# Patient Record
Sex: Female | Born: 1935 | Race: White | Hispanic: No | Marital: Married | Smoking: Never smoker
Health system: Southern US, Community
[De-identification: ages and names within clinical notes are randomized; demographics above are authoritative.]

## PROBLEM LIST (undated history)

## (undated) DIAGNOSIS — I10 Essential (primary) hypertension: Secondary | ICD-10-CM

## (undated) DIAGNOSIS — IMO0001 Reserved for inherently not codable concepts without codable children: Secondary | ICD-10-CM

## (undated) DIAGNOSIS — I2510 Atherosclerotic heart disease of native coronary artery without angina pectoris: Secondary | ICD-10-CM

## (undated) DIAGNOSIS — R339 Retention of urine, unspecified: Secondary | ICD-10-CM

## (undated) DIAGNOSIS — E785 Hyperlipidemia, unspecified: Secondary | ICD-10-CM

## (undated) DIAGNOSIS — F32A Depression, unspecified: Secondary | ICD-10-CM

## (undated) DIAGNOSIS — G20 Parkinson's disease: Secondary | ICD-10-CM

## (undated) DIAGNOSIS — F0390 Unspecified dementia without behavioral disturbance: Secondary | ICD-10-CM

## (undated) DIAGNOSIS — R569 Unspecified convulsions: Secondary | ICD-10-CM

## (undated) DIAGNOSIS — K5792 Diverticulitis of intestine, part unspecified, without perforation or abscess without bleeding: Secondary | ICD-10-CM

## (undated) DIAGNOSIS — G2581 Restless legs syndrome: Secondary | ICD-10-CM

## (undated) DIAGNOSIS — R296 Repeated falls: Secondary | ICD-10-CM

## (undated) DIAGNOSIS — N189 Chronic kidney disease, unspecified: Secondary | ICD-10-CM

## (undated) DIAGNOSIS — K5790 Diverticulosis of intestine, part unspecified, without perforation or abscess without bleeding: Secondary | ICD-10-CM

## (undated) DIAGNOSIS — K2970 Gastritis, unspecified, without bleeding: Secondary | ICD-10-CM

## (undated) DIAGNOSIS — E119 Type 2 diabetes mellitus without complications: Secondary | ICD-10-CM

## (undated) DIAGNOSIS — Z79890 Hormone replacement therapy (postmenopausal): Secondary | ICD-10-CM

## (undated) DIAGNOSIS — M810 Age-related osteoporosis without current pathological fracture: Secondary | ICD-10-CM

## (undated) DIAGNOSIS — R1310 Dysphagia, unspecified: Secondary | ICD-10-CM

## (undated) DIAGNOSIS — Z794 Long term (current) use of insulin: Secondary | ICD-10-CM

## (undated) DIAGNOSIS — Z961 Presence of intraocular lens: Secondary | ICD-10-CM

## (undated) DIAGNOSIS — M549 Dorsalgia, unspecified: Secondary | ICD-10-CM

## (undated) DIAGNOSIS — M5416 Radiculopathy, lumbar region: Secondary | ICD-10-CM

## (undated) DIAGNOSIS — F419 Anxiety disorder, unspecified: Secondary | ICD-10-CM

## (undated) DIAGNOSIS — G8929 Other chronic pain: Secondary | ICD-10-CM

## (undated) DIAGNOSIS — B9681 Helicobacter pylori [H. pylori] as the cause of diseases classified elsewhere: Secondary | ICD-10-CM

## (undated) DIAGNOSIS — R531 Weakness: Secondary | ICD-10-CM

## (undated) DIAGNOSIS — F329 Major depressive disorder, single episode, unspecified: Secondary | ICD-10-CM

## (undated) DIAGNOSIS — R41841 Cognitive communication deficit: Secondary | ICD-10-CM

## (undated) DIAGNOSIS — G4700 Insomnia, unspecified: Secondary | ICD-10-CM

## (undated) DIAGNOSIS — K922 Gastrointestinal hemorrhage, unspecified: Secondary | ICD-10-CM

## (undated) HISTORY — DX: Hyperlipidemia, unspecified: E78.5

## (undated) HISTORY — DX: Helicobacter pylori (H. pylori) as the cause of diseases classified elsewhere: B96.81

## (undated) HISTORY — DX: Other chronic pain: G89.29

## (undated) HISTORY — DX: Insomnia, unspecified: G47.00

## (undated) HISTORY — DX: Gastritis, unspecified, without bleeding: K29.70

## (undated) HISTORY — DX: Long term (current) use of insulin: Z79.4

## (undated) HISTORY — DX: Major depressive disorder, single episode, unspecified: F32.9

## (undated) HISTORY — PX: OTHER SURGICAL HISTORY: SHX169

## (undated) HISTORY — DX: Type 2 diabetes mellitus without complications: E11.9

## (undated) HISTORY — DX: Depression, unspecified: F32.A

## (undated) HISTORY — PX: TONSILLECTOMY: SUR1361

## (undated) HISTORY — DX: Radiculopathy, lumbar region: M54.16

## (undated) HISTORY — DX: Hormone replacement therapy: Z79.890

## (undated) HISTORY — PX: APPENDECTOMY: SHX54

## (undated) HISTORY — DX: Diverticulosis of intestine, part unspecified, without perforation or abscess without bleeding: K57.90

## (undated) HISTORY — DX: Anxiety disorder, unspecified: F41.9

## (undated) HISTORY — DX: Reserved for inherently not codable concepts without codable children: IMO0001

## (undated) HISTORY — DX: Repeated falls: R29.6

## (undated) HISTORY — DX: Chronic kidney disease, unspecified: N18.9

## (undated) HISTORY — DX: Presence of intraocular lens: Z96.1

## (undated) HISTORY — DX: Dorsalgia, unspecified: M54.9

## (undated) HISTORY — PX: CHOLECYSTECTOMY: SHX55

---

## 1981-10-26 HISTORY — PX: TOTAL ABDOMINAL HYSTERECTOMY W/ BILATERAL SALPINGOOPHORECTOMY: SHX83

## 1982-10-26 HISTORY — PX: OTHER SURGICAL HISTORY: SHX169

## 1997-12-24 HISTORY — PX: OTHER SURGICAL HISTORY: SHX169

## 2000-02-02 ENCOUNTER — Ambulatory Visit (HOSPITAL_COMMUNITY): Admission: RE | Admit: 2000-02-02 | Discharge: 2000-02-02 | Payer: Self-pay | Admitting: Psychiatry

## 2000-02-16 ENCOUNTER — Ambulatory Visit (HOSPITAL_COMMUNITY): Admission: RE | Admit: 2000-02-16 | Discharge: 2000-02-16 | Payer: Self-pay | Admitting: Psychiatry

## 2000-03-02 ENCOUNTER — Ambulatory Visit (HOSPITAL_COMMUNITY): Admission: RE | Admit: 2000-03-02 | Discharge: 2000-03-02 | Payer: Self-pay | Admitting: Psychiatry

## 2000-10-27 ENCOUNTER — Encounter: Admission: RE | Admit: 2000-10-27 | Discharge: 2001-01-25 | Payer: Self-pay | Admitting: Anesthesiology

## 2001-03-15 ENCOUNTER — Encounter: Admission: RE | Admit: 2001-03-15 | Discharge: 2001-06-13 | Payer: Self-pay | Admitting: Anesthesiology

## 2001-06-13 ENCOUNTER — Encounter: Admission: RE | Admit: 2001-06-13 | Discharge: 2001-06-25 | Payer: Self-pay | Admitting: Anesthesiology

## 2003-07-17 ENCOUNTER — Ambulatory Visit (HOSPITAL_COMMUNITY): Admission: RE | Admit: 2003-07-17 | Discharge: 2003-07-17 | Payer: Self-pay | Admitting: Gastroenterology

## 2003-07-17 ENCOUNTER — Encounter: Payer: Self-pay | Admitting: Gastroenterology

## 2004-05-09 ENCOUNTER — Emergency Department (HOSPITAL_COMMUNITY): Admission: EM | Admit: 2004-05-09 | Discharge: 2004-05-09 | Payer: Self-pay

## 2007-10-27 HISTORY — PX: OTHER SURGICAL HISTORY: SHX169

## 2008-02-14 ENCOUNTER — Encounter: Payer: Self-pay | Admitting: Gastroenterology

## 2008-02-14 ENCOUNTER — Ambulatory Visit: Payer: Self-pay | Admitting: Gastroenterology

## 2008-02-14 DIAGNOSIS — R195 Other fecal abnormalities: Secondary | ICD-10-CM

## 2008-02-14 DIAGNOSIS — I10 Essential (primary) hypertension: Secondary | ICD-10-CM

## 2008-02-14 DIAGNOSIS — E119 Type 2 diabetes mellitus without complications: Secondary | ICD-10-CM | POA: Insufficient documentation

## 2008-03-05 ENCOUNTER — Telehealth: Payer: Self-pay | Admitting: Gastroenterology

## 2008-03-06 ENCOUNTER — Encounter: Payer: Self-pay | Admitting: Gastroenterology

## 2008-03-13 ENCOUNTER — Ambulatory Visit: Payer: Self-pay | Admitting: Gastroenterology

## 2008-03-13 ENCOUNTER — Encounter: Payer: Self-pay | Admitting: Gastroenterology

## 2008-03-14 ENCOUNTER — Encounter: Payer: Self-pay | Admitting: Gastroenterology

## 2008-03-21 ENCOUNTER — Telehealth: Payer: Self-pay | Admitting: Gastroenterology

## 2008-05-02 ENCOUNTER — Ambulatory Visit: Payer: Self-pay | Admitting: Cardiovascular Disease

## 2008-05-02 ENCOUNTER — Ambulatory Visit: Payer: Self-pay

## 2008-05-03 ENCOUNTER — Ambulatory Visit: Payer: Self-pay | Admitting: Gastroenterology

## 2008-05-03 LAB — CONVERTED CEMR LAB
OCCULT 1: NEGATIVE
OCCULT 2: NEGATIVE
OCCULT 3: NEGATIVE
OCCULT 4: NEGATIVE

## 2008-05-07 ENCOUNTER — Ambulatory Visit: Payer: Self-pay

## 2008-07-23 ENCOUNTER — Inpatient Hospital Stay (HOSPITAL_COMMUNITY): Admission: RE | Admit: 2008-07-23 | Discharge: 2008-07-27 | Payer: Self-pay | Admitting: Orthopedic Surgery

## 2009-08-07 ENCOUNTER — Ambulatory Visit: Payer: Self-pay | Admitting: Cardiology

## 2009-08-12 ENCOUNTER — Encounter: Payer: Self-pay | Admitting: Cardiology

## 2009-08-12 ENCOUNTER — Telehealth (INDEPENDENT_AMBULATORY_CARE_PROVIDER_SITE_OTHER): Payer: Self-pay | Admitting: *Deleted

## 2010-12-25 DIAGNOSIS — R296 Repeated falls: Secondary | ICD-10-CM

## 2010-12-25 HISTORY — DX: Repeated falls: R29.6

## 2011-03-10 NOTE — Op Note (Signed)
NAME:  Erika Mcconnell, Erika Mcconnell        ACCOUNT NO.:  [id number]   MEDICAL RECORD NO.:  [id number]          PATIENT TYPE:  INP   LOCATION:  0001                         FACILITY:  [facility]   PHYSICIAN:  Ollen Gross, M.D.    DATE OF BIRTH:  06-10-1929   DATE OF PROCEDURE:  07/23/2008  DATE OF DISCHARGE:                               OPERATIVE REPORT   PREOPERATIVE DIAGNOSIS:  Osteoarthritis right hip.   POSTOPERATIVE DIAGNOSIS:  Osteoarthritis right hip.   PROCEDURE:  Right total hip arthroplasty.   SURGEON:  Ollen Gross, M.D.   ASSISTANT:  Alexzandrew L. Perkins, P.A.C.   ANESTHESIA:  General.   ESTIMATED BLOOD LOSS:  300.   DRAINS:  Hemovac times one.   COMPLICATIONS:  None.   CONDITION:  Stable to recovery room.   BRIEF CLINICAL NOTE:  Erika Mcconnell is a 77 year old female who has end-  stage arthritis of her right hip with progressively worsening pain and  dysfunction.  She has had intractable pain and presents now for right  total hip arthroplasty.   PROCEDURE IN DETAIL:  After successful administration of general  anesthetic, the patient was placed in the left lateral decubitus  position with the right side up and held with the hip positioner.  Right  lower extremity was isolated from her perineum with plastic drapes and  prepped and draped in the usual sterile fashion.  Short posterolateral  incision was made with a 10 blade through subcutaneous tissue to the  level of fascia lata which was incised in line with the skin incision.  Sciatic nerve was palpated and protected and short external rotator was  isolated off the femur.  Capsulectomy was performed and the hip was  dislocated.  The center of femoral head is marked and a trial prosthesis  was placed such that the center of the trial head corresponds to the  center of the native femoral head.  Osteotomy line was marked on the  femoral neck and osteotomy made with an oscillating saw.  Femoral head  is removed and  the femur retracted anteriorly to gain acetabular  exposure.   The acetabular retractors were placed and labrum and osteophytes  removed.  Reaming starts at 45 mm, coursing increments of 2-51 mm and a  52-mm Pinnacle acetabular shell was placed in anatomic position and  transfixed with two dome screws.  The trial 32-mm neutral +4 liner was  placed.   The femur was prepared with the canal finder and irrigation.  Axial  reaming was performed with 13.5 mm proximal, reaming to an 18 F and the  sleeve machined to a large.  An 46 F large trial sleeve was placed with  the 18 x 13 stem and 36 plus eight neck about 10 degrees beyond native  anteversion.  Trial of 32 plus zero head was placed and the hip was  reduced with outstanding stability.  There was full extension, full  external rotation, 70 degrees of flexion, 40 degrees of adduction and 90  degrees of internal rotation then 90 degrees of flexion and 70 degrees  of internal rotation.  By placing the right leg on top  of the left it  felt as though leg lengths were equal.  The hip was dislocated and all  trials are removed.  The permanent apex hole eliminator was placed in  the acetabular shell and the permanent 32 mm neutral +4 Marathon liner  was placed.  On the femoral side we placed a permanent 18 F large sleeve  with a 18 x 13 stem and 36 plus eight neck 10 degrees beyond native  anteversion.  The 32 plus zero head was placed.  The hip was reduced  with the same stability parameters.  The wound was copiously irrigated  with saline solution and short rotators reattached to the femur through  drill holes.  Fascia lata was closed over Hemovac drain with interrupted  #1 Vicryl, subcu closed with #1 and 2-0 Vicryl and subcuticular running  4-0 Monocryl.  The drain was hooked to suction.  Incision cleaned and  dried and Steri-Strips and bulky sterile dressing applied.  She was then  placed into a knee immobilizer, awakened and transferred to  recovery in  stable condition.      Ollen Gross, M.D.  Electronically Signed     FA/MEDQ  D:  07/23/2008  T:  07/24/2008  Job:  [id number]

## 2011-03-10 NOTE — H&P (Signed)
NAME:  Erika Mcconnell, Erika Mcconnell        ACCOUNT NO.:  [id number]   MEDICAL RECORD NO.:  [id number]         PATIENT TYPE:  LINP   LOCATION:                               FACILITY:  [facility]   PHYSICIAN:  Ollen Gross, M.D.    DATE OF BIRTH:  [birth year]   DATE OF ADMISSION:  07/23/2008  DATE OF DISCHARGE:                              HISTORY & PHYSICAL   DATE OF OFFICE VISIT HISTORY AND PHYSICAL:  July 17, 2008.   DATE OF ADMISSION:  July 23, 2008.   CHIEF COMPLAINT:  Right hip pain.   HISTORY OF PRESENT ILLNESS:  The patient is a 77 year old female who has  been seen by Dr. Lequita Halt for ongoing right hip pain, leg pain that has  been ongoing for quite some time now, it starts in the groin and goes  down the leg.  She has been seen in the office and found have a severely  arthritic hip with significant cystic changes all throughout the femoral  head and the acetabulum with bone-on-bone and multiple sclerotic  changes.  It is felt she would benefit from undergoing a right total hip  replacement arthroplasty, risks and benefits have been discussed, she  elects to proceed with surgery.  She has been seen by Dr. Arlyce Dice and  felt to be stable for surgery.  She has been seen by Lindaann Pascal, PA-C,  for cardiology and is felt to be stable for surgery.   ALLERGIES:  1. OXYCONTIN causes a rash.  2. FENTANYL PATCH can't take.  Please note the patient is able to take Vicodin.   CURRENT MEDICATIONS:  Humulin, Actos, quinapril, she is taking  oxycodone, Cymbalta, Ambien, Sanctura, Lyrica, Xanax, Centrum Silver,  Vytorin, glucosamine chondroitin, Omega-3 Fish Oil, magnesium and a  stool softener.   PAST MEDICAL HISTORY:  Diabetes, hypertension, arthritis, disk disease.   PAST SURGICAL HISTORY:  1. Multiple back surgeries.  2. She does have a back pain implant.  3. Hysterectomy.  4. Cholecystectomy.  5. Cataracts with lens implants.   SOCIAL HISTORY:  Married.  No tobacco, no alcohol.   Three children.   FAMILY HISTORY:  Father deceased age 70 secondary to MVA.  Mother  deceased age 37 with breast cancer, she was a smoker with COPD.   REVIEW OF SYSTEMS:  GENERAL:  No fevers, chills, night sweats.  NEURO:  No seizures, syncope or paralysis.  RESPIRATORY:  No shortness breath, productive cough or hemoptysis.  CARDIOVASCULAR:  No chest pain, angina, orthopnea.  GI:  No nausea, vomiting, diarrhea or constipation.  GU:  No dysuria, hematuria or discharge.  MUSCULOSKELETAL:  Right hip.   PHYSICAL EXAM:  VITAL SIGNS:  Pulse 72, respirations 14, blood pressure  152/68.  GENERAL:  This is a 77 year old white female well-nourished, well-  developed, in no acute distress.  She is alert, oriented, and  cooperative, very pleasant, accompanied by her daughter.  HEENT:  Normocephalic, atraumatic.  Pupils are round, reactive.  Oropharynx clear.  EOMs intact.  NECK:  Supple.  CHEST:  A faint inspiratory wheeze at the left mid posterior lung field.  This does clear with coughing.  HEART:  Regular rate and rhythm without murmur or S1, S2 noted.  ABDOMEN:  Soft, round, bowel sounds present.  RECTAL/BREASTS/GENITALIA:  Not done, not pertinent to present illness.  EXTREMITIES:  Right hip flexion 90, minimal internal rotation, minimal  external rotation, full extension.   IMPRESSION:  Osteoarthritis right hip.   PLAN:  The patient admitted to [facility], will undergo a  right total replacement arthroplasty.  Surgery will be performed by Dr.  Ollen Gross she does want to look into the skilled nursing facility at  [facility] in [place], [place] to be close to family.      Alexzandrew L. Perkins, P.A.C.      Ollen Gross, M.D.  Electronically Signed    ALP/MEDQ  D:  07/22/2008  T:  07/23/2008  Job:  [id number]   cc:   Ernestina Penna, M.D.  Fax: [phone]   Noralyn Pick. Eden Emms, MD, [facility]  [address]  [address]  [place]  [place] [address]   Barbette Hair. Arlyce Dice,  MD,FACG  [address]  [place]  [place] [address]

## 2011-03-10 NOTE — Assessment & Plan Note (Signed)
[facility]                            CARDIOLOGY OFFICE NOTE   NAME:Mcconnell, Erika Mcconnell MARISCAL                    MRN:          [id number]  DATE:05/02/2008                            DOB:          [birth year]    HISTORY:  Erika Mcconnell Mcconnell is a pleasant 77 year old patient who is  referred by Dr. Varney Baas group for preop clearance.  She is a 30  plus year insulin-dependent diabetic.  She has no documented coronary  artery disease.  Her activity is severely limited by right hip pain.  She has had received multiple cortisone shots.  She needs to have a  right hip replacement.  Apparently, Dr. Kathi Der group heard a right  carotid bruit.  She is already prescheduled for carotid Dopplers today  at 10.  I talked to the patient at length.  She does get some exertional  dyspnea.  She has not had a chest pain.  She has not had a previous  cardiac workup in regards to her diabetes.  She has had some eye  problems, but these have been more cataract.  She may have mild  peripheral neuropathy, but no other end-organ damage.   The patient has not had chest pain, diaphoresis, palpitations, or  presyncope.  She has had multiple back surgeries, a hysterectomy, and a  gallbladder surgery without any previous anesthetic problems or  complications.   She has not had a stress test.  I told her given the fact that she needs  fairly significant orthopedic surgery and that she is a longstanding  diabetic, she needed an adenosine Myoview.   We will try to get this scheduled as soon as possible.   PAST MEDICAL HISTORY:  Otherwise remarkable for:  1. Previous hysterectomy.  2. Five disk surgeries in the back.  3. Previous implantable pain device at [facility].  4. History of cholecystectomy.  5. Longstanding diabetes.   ALLERGIES:  OXYCODONE and FENTANYL.   SOCIAL HISTORY:  She is a nonsmoker, nondrinker.  Her husband is living,  but in a rest home for Alzheimer's.  She can  barely walk and uses a  cane.  She lives and spends most of her time with her daughter.  She is  retired.   FAMILY HISTORY:  Remarkable for mother dying at age 30 of old age.  Father dying age 45 in an accident.   CURRENT MEDICATIONS:  1. Accupril 40 a day.  2. Ambien.  3. Humulin insulin 55 units in the morning.  4. Hydrocodone.  5. Cymbalta 60 mg 2 tablets a day.  6. Sanctura XR 60 mg bedtime.  7. Lyrica 75 mg bedtime.  8. Glucosamine.  9. Vytorin 10/40.  10.Magnesium.  11.Stool softeners.   PHYSICAL EXAMINATION:  GENERAL:  Remarkable for an elderly white female  with premature gray hair.  VITAL SIGNS:  Weight is 187, blood pressure 140/60, pulse 100 and  regular, respiratory rate 14, and afebrile.  HEENT:  Unremarkable.  NECK:  There is a faint right carotid bruit that is difficult to hear.  No lymphadenopathy, thyromegaly, or JVP elevation.  LUNGS:  Clear, good  diaphragmatic motion.  No wheezing.  S1 and S2 with  normal heart sounds.  PMI normal.  ABDOMEN:  Benign.  Status post cholecystectomy and hysterectomy.  No  bruit.  No AAA.  No tenderness.  No hepatosplenomegaly.  No  hepatojugular reflux.  EXTREMITIES:  Distal pulses were intact.  Trace right lower extremity  edema.  NEURO:  Nonfocal.  SKIN:  Warm and dry.  MUSCULOSKELETAL:  No muscular weakness.  She has significant pain and  decreased range of motion in the right hip and walks with a limp.   I do not have a baseline EKG on the patient.  EKG done here in our  office shows sinus rhythm with poor R-wave progression, left axis  deviation.   IMPRESSION:  1. Preoperative clearance in a longstanding diabetic with hypertension      and hyperlipidemia.  The patient will have an adenosine Myoview      study.  2. Hypertension, currently well controlled.  Continue current dose of      Accupril particularly given the protein-sparing effects with      diabetes.  3. Hypercholesterolemia.  Continue Vytorin 10/40.   Lipid and liver      profile in 6 months.  4. Question faint right carotid bruit.  Follow up carotid duplex here      today.   As long as the patient's Myoview is low risk, we will allow her to have  hip surgery.  She clearly needs it for pain control and increased  ambulation.  I doubt that her carotids are significant enough to warrant  canceling any hip surgery.     Erika Mcconnell Pick. Eden Emms, MD, [facility]  Electronically Signed   PCN/MedQ  DD: 05/02/2008  DT: 05/02/2008  Job #: [id number]   cc:   [facility]  Ernestina Penna, M.D.

## 2011-03-10 NOTE — Discharge Summary (Signed)
NAME:  Erika Mcconnell, Erika Mcconnell        ACCOUNT NO.:  [id number]   MEDICAL RECORD NO.:  [id number]          PATIENT TYPE:  INP   LOCATION:  1620                         FACILITY:  [facility]   PHYSICIAN:  Ollen Gross, M.D.    DATE OF BIRTH:  1929/02/06   DATE OF ADMISSION:  07/23/2008  DATE OF DISCHARGE:  07/27/2008                               DISCHARGE SUMMARY   ADMITTING DIAGNOSES:  1. Osteoarthritis, right hip.  2. Diabetes.  3. Hypertension.  4. Arthritis.  5. Disk disease.   DISCHARGE DIAGNOSES:  1. Osteoarthritis, right hip status post right total hip replacement      arthroplasty.  2. Postop confusion/altered mental status, resolved.  3. Postop azotemia/renal insufficiency, improved.  4. Postop blood loss anemia status post transfusion.  5. Diabetes.  6. Hypertension.  7. Arthritis.  8. Disk disease.   PROCEDURE:  July 23, 2008, right total hip.  Surgeon was Ollen Gross, M.D. and assistant was Alexzandrew L. Perkins, P.A.C.  Anesthesia was general.   CONSULTS:  None.   BRIEF HISTORY:  Erika Mcconnell is a 77 year old female with end-stage  arthritis of the right hip, progressive worsening pain and dysfunction  and intractable pain and now presents for a total hip arthroplasty.   LABORATORY DATA:  Preop urinalysis was positive for urine nitrites and  large leukocyte esterase with 21-50 white cells, 0-2 red cells, rare  squamous and many bacteria.  This was treated preoperatively.  CBC preop  showed a low hemoglobin of 10.9, hematocrit of 32.6, white cell count  6.7, platelets 225.  Chem panel on admission showed elevated glucose of  200, elevated BUN of 35, mildly elevated creatinine of 1.25.  Remaining  chem panel within normal limits.  PT/INR preop 14.1 and 1.1 with a PTT  of 35.  Serial pro-times were followed throughout the hospital course  per Coumadin protocol.  Last PT/INR 24.0 and 2.0.  Serial CBCs were  followed.  Hemoglobin did drop down to 8.1 postop.   She was given blood.  Post hemoglobin back up to 10.  Last noted H and H back up to 11.1 and  32.6.  Serial BMETs were followed.  BUN went up to 41 but came back down  to 20.  Creatinine did go up postop to 1.5 then to 2.1 at its highest  point and then came down to 1.4 and last noted creatinine was back down  to a normal level of 1.1.  Followup UA did show some moderate blood,  large leuks, few squamous, many white cells, 3-6 red cells, few  bacteria.  The urine culture did prove to be negative though.   X-RAYS:  Preop chest x-ray on July 20, 2008, no acute  cardiopulmonary process.  Postop x-ray on July 24, 2008, portable  chest low lung volumes with left greater than right basilar opacity,  opacities favor atelectasis, could be small pleural effusion.  Followup  chest x-ray on July 25, 2008, stable chest x-ray findings without  focal disease.   EKG July 20, 2008, sinus rhythm, first-degree AV block, left  anterior fascicular block, no old tracing to compare per Dr. Renae Fickle  Green.  She did have a preop stress test dated May 07, 2008, normal stress  nuclear test.   HOSPITAL COURSE:  The patient was [facility] and tolerated the  procedure well, later transferred to the recovery room and orthopedic  floor.  Started on PCA and p.o. analgesic pain control following  surgery.  Had a little bit of slight confusion on the evening of surgery  and had some mild confusion on the morning of day 1.  Had a hemoglobin  low.  The confusion felt be due to multiple reasons with the low  hemoglobin, narcotics and anesthesia.  We decided to give her blood.  She did receive 2 units.  We checked a chest x-ray because of the mental  status and it did not show anything except maybe possibly some  atelectasis.  Encouraged incentive spirometer.  She did have a little  bit of azotemia and mild renal insufficiency.  The creatinine had gone  up from 1.2 to 2.1 which turned out to be  its highest point.  We did  resend a urine because of the mental status changes.  Her UA was done  but the culture did prove to be negative.  She did receive antibiotics  preoperatively.  She also received 24 hours postop IV antibiotics at  high-dose postoperatively.  On the evening of day 1 she had had a rough  night.  She pulled out her Foley and it had to be reinserted.  She got a  little bit more confused and by the morning day of 2 we had stopped all  her narcotics and changed her meds around.  Her output had improved and  her creatinine started coming back down to 1.4.  She did have a little  bit of bloody drainage on the incision but other than that it looked  okay.  Her hemoglobin was back up but she was still confused so we  checked a chest x-ray again to make sure she was not developing  pneumonia.  No focal disease.  Urine culture did prove to be positive.  Creatinine was improving.  Felt this was all mostly medication narcotics  so continued to hold those medications.  By the morning of day 3 she  started doing a little bit better so we reinitiated her Xanax which she  was on normally.  She was diuresing her fluid well.  Her hemoglobin was  11.  She started to improve when it was a little bit more closer to  baseline although had not completely resolved.  Her renal insufficiency  had resolved.  Her creatinine was back down to 1.1.  It was felt that as  she continued to improve that she possibly was ready for skilled  nursing.  FL2 was sent out.  Bed offers were made.  By the following day  of July 27, 2008, on postop day 4 her confusion had completely  resolved.  It was noted a bed became available at [facility] at [place].  The patient was doing well, slowly progressing with therapy but from a  medical standpoint she was much better.  Confusion resolved and renal  insufficiency improved.  She was ready to go home at that time.   DISCHARGE PLAN:  The patient will be  transferred over to [place] at  [place].   DISCHARGE DIAGNOSES:  Please see above.   DISCHARGE MEDS:  1. She currently was taking Humulin N insulin 55 units subcu every      morning.  2. Actos 15 mg daily.  3. Lisinopril 40 mg p.o. daily.  4. Cymbalta 60 mg p.o. q.h.s.  5. Sanctura XR 60 mg p.o. q.h.s.  6. Lyrica 75 mg p.o. q.h.s.  7. Vytorin 10/40 p.o. q.h.s.  8. She is on Coumadin protocol.  Please titrate Coumadin level for      target INR between 2.0 and 3.0.  She needs to be on Coumadin for 3      weeks from the date of surgery of July 23, 2008.  9. Colace 100 mg p.o. b.i.d.  10.Nu-Iron 150 mg p.o. daily  11.Xanax.  She was on 1 mg but we reduced it to now she is currently      on Xanax 0.5 mg p.o. t.i.d.  12.Tylenol 325 one or two every 4 hours as needed for pain.  13.Robaxin 500 mg p.o. q.6 hours p.r.n. spasm.  14.Ultram 50 mg to 100 mg p.o. q.4 hours p.r.n. pain.  15.Dulcolax suppository as needed.  16.Laxative of choice.   DIET:  Low-sodium, heart-healthy diabetic diet.  Medium modified carb.   ACTIVITY:  She is partial weightbearing 25-50% to the right lower  extremity and maintain hip precautions total hip protocol.  Do not  advance weightbearing status yet.  PT and OT for gait training,  ambulation, ADLs.  She may start showering, however, do not submerge  incision under water.   DISPOSITION:  She is going to [place] at [place].   CONDITION ON DISCHARGE:  Improved.      Alexzandrew L. Perkins, P.A.C.      Ollen Gross, M.D.  Electronically Signed    ALP/MEDQ  D:  07/27/2008  T:  07/27/2008  Job:  [id number]   cc:   Ollen Gross, M.D.  Fax: [phone]   Ernestina Penna, M.D.  Fax: [phone]   Noralyn Pick. Eden Emms, MD, [facility]  [address]  [address]  [place]  [place] [address]   Barbette Hair. Arlyce Dice, MD,FACG  [address]  [place]  [place] [address]

## 2011-03-13 NOTE — Procedures (Signed)
[facility]  Patient:    Erika Mcconnell, Erika Mcconnell                    MRN: [id number] Proc. Date: 05/02/01 Adm. Date:  [id number] Attending:  Thyra Breed CC:         Genene Churn. Love, M.D.  Monica Becton, M.D.  Ollen Gross, M.D.   Procedure Report  PROCEDURE:  SI nerve root block.  DIAGNOSES:  Chronic low back pain with lumbar spondylosis and epidural scarring around the L5, S1 nerve roots with associated radiculopathies.  INTERVAL HISTORY:  The patient has noted that most of her pain now is localized to the right buttock region and is at a level of 5/10, significantly reduced from when she initially presented for nerve root blocks.  Her neck discomfort from the previous procedure has significantly improved overall with Flexeril.  She found this somewhat to be intolerant.  Her other medications are insulin, Zoloft, roxicodone, Premarin, Lipitor, and Accupril.  PHYSICAL EXAMINATION:  Blood pressure standing was 87/41, heart rate is 69, respiratory rate 16, O2 saturations 99%.  Pain level is 5/10.  Supine, her blood pressure is 120/70.  Her neuro exam is grossly unchanged.  DESCRIPTION OF PROCEDURE:  After informed consent was obtained, the patient was placed in the prone position with a pillow under her abdomen and a pillow to support her neck.  Using fluoroscopic guidance, I identified the S1 neuroforamen and marked the area overlying the skin.  The area was prepped with Betadine x 3 and draped.  I anesthetized the skin with a 25 gauge needle using 1% lidocaine.  A 22 gauge Chiba needle was introduced into the S1 foramen, confirmed by lateral, AP, and oblique projections.  Aspiration was negative for blood and CSF.  I injected 1 cc of 1% lidocaine with no evidence of a spinal.  Lidocaine 2 cc of 1% with 40 mg of Medrol was injected into the S1 nerve root.  The needle was flushed with 1% lidocaine and removed intact.  POSTPROCEDURE CONDITION:   Stable with attenuation in discomfort.  DISCHARGE INSTRUCTIONS: 1. Resume previous diet. 2. Limitations on activities per instruction sheet. 3. Continue on current medications with a prescription written for Flexeril 10    mg 1 p.o. q.8h. p.r.n. #100 with 1 refill. 4. Follow up with me in three months to consider repeat block of the S1 nerve    root versus the combination of L5-S1.  She is encouraged to call should she    develop problems between now and then. DD:  05/02/01 TD:  05/02/01 Job: [id number]

## 2011-03-13 NOTE — Procedures (Signed)
[facility]  Patient:    Erika Mcconnell, Erika Mcconnell                    MRN: [id number] Proc. Date: 03/16/01 Adm. Date:  [id number] Attending:  Thyra Breed CC:         Genene Churn. Love, M.D.  Monica Becton, M.D.   Procedure Report  PROCEDURE:  Lumbar epidural steroid injection.  DIAGNOSIS:  Lumbar radiculopathy with underlying lumbar spondylosis and epidural scarring.  INTERVAL HISTORY:  The patient was last seen back in January and got temporary improvements with her epidural steroid injections. I got a note that she had seen Dr. Lequita Halt on April 25 at which time he was very concerned about her low back and buttock pain. She had evidence of her fusion on her previous x-rays at that time and he was concerned that she might benefit from more selective nerve blocks. She presents today as previously scheduled so that she can get an epidural prior to her granddaughters graduation and in the process of discussions, I explained to her that her pain is on the basis of scarring around the nerve roots which was evident on her MRI done over at [facility] on August 04, 1996 where she was noted to have fibrosis about the L5 nerve root and the left S1 nerve root following her fusion. She received temporarily relief from the epidurals previously. She is on Duragesic patch which she states helps to a degree to reduce her discomfort but she continues to have a pain that she describes as burning in quality predominantly down the posterior aspect of the right lower extremity to a lesser extent and to the left calf region. It is made worse by sitting and standing and riding and improved by bed rest and her analgesics. She is also on Accupril as well as insulin, Premarin and Zoloft.  PHYSICAL EXAMINATION:  Blood pressure 185/83, heart rate 77, respiratory rate 16, O2 saturations 97% and pain level is 8/10. She demonstrates well healed surgical scars over her lower  back. Deep tendon reflexes were symmetric in the upper extremities, absent in the lower extremities with negative straight leg raise signs. She has attenuated pinprick in the lower extremity left along the lateral aspect of the calf and right more along the posterior aspect.  IMPRESSION: 1. Chronic low back pain with underlying lumbar spondylosis status post    fusion with epidural scarring around L5 and S1. 2. Multiple other medical problems per Dr. Rudi Heap which include    insulin dependent diabetes, diabetic peripheral neuropathy, hypertension,    osteoarthritis, and remote history of migraine headaches.  DISPOSITION:  I discussed with the patient treatment options which would include increasing the Duragesic to 50 mcg and another epidural so that she can make it through her granddaughters graduation tomorrow. I advised her that we would probably need to consider selective nerve root blocks with cortical steroids to see whether we could get better pain control and she is willing to go with this.  DESCRIPTION OF PROCEDURE:  After informed consent was obtained, the patient was placed in the sitting position and monitored. The patients back was prepped with Betadine x 3. A skin wheal was raised at the L4-5 interspace with 1 percent lidocaine. A 20 gauge Tuohy needle was introduced to the lumbar epidural space to loss of resistance to preservative free normal saline. There was no cerebrospinal fluid nor blood. The 80 mg of Medrol and 6 ml of preservative  free normal saline was gently injected.  The needle was flushed and removed intact.  CONDITION POST PROCEDURE:  Stable.  DISCHARGE INSTRUCTIONS:  Resume previous diet. Limitations in activities per instruction sheet. Continue on current medications with the exception of increasing the Duragesic to 50 mcg one every three days #10 with no refill. Follow-up with me for selective nerve root blocks. DD:  03/16/01 TD:  03/17/01 Job:  [id number]

## 2011-03-13 NOTE — H&P (Signed)
[facility]  Patient:    Erika Mcconnell, Erika Mcconnell                    MRN: [id number] Adm. Date:  [id number] Attending:  Thyra Breed CC:         Genene Churn. Love, M.D.  Monica Becton, M.D.   History and Physical  FOLLOW-UP EVALUATION:  Donyel comes in for follow-up evaluation because of neck discomfort, which she attributes to being prone for her nerve blocks.  I advised her that it is not an unusual situation.  She had originally asked for an injection in her shoulder.  Since her last injection on April 15, 2001, she has noted that her lower back and leg discomfort are markedly improved.  She has minimal complaints relative to this.  She complains of neck discomfort, which feels as though there is a tightness in the neck radiating out to the shoulder region.  There is no associated numbness or tingling or weakness.  She notes that her pain is made worse by use of her upper extremities, especially of her shoulders.  Current medications are insulin, Zoloft, Roxicodone, which she is taking 5 mg up to two every four hours, Premarin, Lipitor, and Accupril.  She has had some nightmares since her injection, which may be related to the corticosteroids.  PHYSICAL EXAMINATION:  VITAL SIGNS:  Blood pressure is 156/57, heart rate 76, respiratory rate 16, O2 saturation 96%.  Pain level is 10 in her neck, minimal in her lower back.  MUSCULOSKELETAL/NEUROLOGIC:  She demonstrated mild restriction of range of motion of her neck with tenderness at the base of the neck and the musculature, radiating out to the trapezius muscle, to the shoulders.  Range of motion of her shoulders was intact.  There was some mild crepitus.  IMPRESSION: 1. Neck pain, which is likely an exacerbation of her underlying cervical    degenerative changes, which I suspect are present. 2. Low back pain on the basis of a postlaminectomy pain syndrome with    underlying epidural scarring and  lumbar spondylosis, markedly improved    after injections. 3. Other medical problems per Dr. Christell Constant.  DISPOSITION:  I advised the patient that rather than injecting her today, I would like to go ahead and try her on a seven-day course of muscle relaxants. She has been on Flexeril recently and noted it was not helpful about a month ago.  I went ahead and put her on Soma 350 mg q.8h., #21 with two refills.  She is scheduled to see me back in a couple of weeks. DD:  04/21/01 TD:  04/21/01 Job: [id number]

## 2011-03-13 NOTE — Procedures (Signed)
[facility]  Patient:    Erika Mcconnell, Erika Mcconnell                    MRN: [id number] Proc. Date: 10/29/00 Adm. Date:  [id number] Attending:  Thyra Breed CC:         Genene Churn. Love, M.D.  Monica Becton, M.D.   Procedure Report  PROCEDURE:  Lumbar epidural steroid injection.  DIAGNOSIS:  Chronic lumbar radiculopathy and sciatica with underlying lumbar spondylosis characterized by lumbar degenerative disk disease at multiple levels and scarring about the L5 nerve root and left S1 nerve root.  ANESTHESIOLOGIST:  Thyra Breed, M.D.  HISTORY:  Erika Mcconnell is a 77 year old patient who was sent to [place] by Dr. Avie Echevaria for a series of lumbar epidural steroid injections.  The patient was in her usual state of health up until 1977 when she had her first diskectomy by Dr. Ples Specter.  She did well until 1984.  At that time, she saw Dr. Earlene Plater and underwent another back procedure and did well up until 1995 when Dr. Wyline Mood did a third procedure which sounds as though it was a fusion.  She did not do well subsequent to this.  She was seen by Dr. Arnell Asal at [facility], and a trial of a dorsal column stimulator was performed.  She apparently had a positive outcome and had good success for about two to three months and apparently, even with reprogramming, did not have good response.  She subsequently has been treated with short-acting opiates by Dr. Sandria Manly with Tylenol No. 3 and Darvocet and eventually, three months ago, Dr. Rudi Heap put her on OxyContin 10 mg b.i.d. with oxycodone for breakthrough pain 5 mg q.6h. p.r.n., taking up to 3 tablets per day.  She has noted a 60% reduction of her pain.  She was also recently switched to Zoloft three weeks ago, and her pain has increased in severity.  She was formerly taking amitriptyline for years which apparently was helping her pain fairly significantly.  She describes her back pain  as a sharp, burning pain which radiates from the lower back anteriorly and out to the right lower extremity along the posterior aspect of the thigh.  It is made worse by activity and improved by rest, electric blanket heat, and her medications.  She had numbness and tingling along the posterior aspect of the right lower extremity and sciatic distribution.  She denied weakness or bowel or bladder incontinence.  She has been somewhat stressed trying to take care of her husband who has some sort of memory dysfunction, possibly early Alzheimers, and is quite emotionally labile at times.  The patient has a 20+ year history of diabetes complicated by dysesthesias of the feet bilaterally diagnosed as peripheral neuropathy secondary to this but no apparent eye problems except for status post cataract surgery and no renal problems.  She has had a series of epidurals performed back in May 2001 and apparently had a partial response.  CURRENT MEDICATIONS: 1. OxyContin 10 mg b.i.d. 2. Oxycodone 5 mg p.r.n. 3. Zoloft 50 mg q.d. 4. Insulin. 5. Accupril. 6. Lipitor. 7. Premarin.  ALLERGIES:  No known drug allergies.  FAMILY HISTORY:  Positive for diabetes and cancer of the breast.  PAST MEDICAL HISTORY AND ACTIVE MEDICAL PROBLEMS:  Include insulin-dependent diabetes mellitus, back problems as mentioned above, hypertension, and osteoarthritis.  She gives a history of migraine headaches which resolved with her hysterectomy.  SOCIAL HISTORY:  The patient is a nonsmoker, nondrinker.  She retired from raising tobacco several years ago.  She currently lives with her husband.  PAST MEDICAL HISTORY:  Significant for the three back surgeries as mentioned above, dorsal column stimulator placement, cholecystectomy, and hysterectomy.  REVIEW OF SYSTEMS:  General: Negative.  Head: See Active Medical Problems. Eyes: Negative.  Nose, Mouth, Throat: Negative.  Ears: She has recurrent right ear pain.   Pulmonary: Negative.  Cardiovascular: Significant for hypertension. GI: Negative.  GU: Negative.  Musculoskeletal: The patient does have some arthralgias of the shoulders and hands.  Neurologic: See HPI for pertinent positives.  No history of seizures or strokes.  Hematologic: Negative. Cutaneous: Significant for itching when she was first started on the OxyContin which resolved with continued use.  Psychiatric: She is under a lot of stress and anxiety with the care of her husband.  Allergy and Immunologic: Negative.  PHYSICAL EXAMINATION:  VITAL SIGNS:  Blood pressure 167/57, heart rate 91, respiratory rate 18, O2 saturation 98%.  Pain level was described as a level of 10.  GENERAL:  This is a very pleasant female who looks her stated age.  HEENT:  Head was normocephalic, atraumatic.  Eyes: Extraocular movements intact with conjunctivae and sclerae clear.  She had bilateral arcus senilis. Nose: Patent nares without discharge.  Oropharynx demonstrated good dentition with intact mucosa.  NECK:  Demonstrated good range of motion with carotids 2+ and symmetric without bruits.  LUNGS:  Clear.  HEART:  Regular rate and rhythm.  ABDOMEN/GENITALIA/RECTAL:  Exams not performed.  BACK:  Some mild scoliosis to the thoracic spine with the left shoulder being lower than the right.  She had well-healed surgical scars in the lower thoracic region and in the lower lumbar spinal regions.  Straight leg raise signs were negative.  EXTREMITIES:  Radial pulses and dorsalis pedis pulses were 2+ and symmetric.  NEUROLOGIC:  The patient is oriented x 4.  Cranial nerves II-XII grossly intact.  Deep tendon reflexes were 2+ and symmetric in the upper extremities, absent in the lower extremities with downgoing toes.   Motor was 5/5 with symmetric bulk and tone.  Coordination was grossly intact.  Sensory exam revealed attenuated pinprick to the knees bilaterally with intact  vibratory  sense.  IMPRESSION: 1. Chronic low back pain with underlying lumbar spondylosis and evidence of    epidural scarring around L5 and S1 by most recent MRI provided which was an    interpretation from October 1997. 2. Multiple other medical problems per Dr. Rudi Heap which include    insulin-dependent diabetes, diabetic peripheral neuropathy, hypertension,    osteoarthritis, and remote history of migraine headaches.  DISPOSITION:  I discussed with the patient the potential risks, benefits, and limitations of a lumbar epidural steroid injection as well as the potential risks and side effects.  The patient is interested in pursuing this.  PROCEDURE:  After informed consent was obtained, the patient was placed in the sitting position and monitored.  Her back was prepped with Betadine x 3.  A skin wheal was raised at the L3-4 interspace with 1% lidocaine.  A 20-gauge Tuohy needle was introduced in the lumbar epidural space to loss of resistance to preservative free normal saline.  There was no CSF nor blood.  The depth was 6.5 cm.  Then 40 mg of Medrol and 8 ml preservative free normal saline was gently injected.  The needle was flushed with preservative free normal saline and removed intact.  POSTPROCEDURE CONDITION:  Stable.  DISCHARGE INSTRUCTIONS: 1. Resume previous diet. 2. Limitation of activities per instruction sheet as outlined by my assistant    today. 3. Continue on current medications.  I did advise the patient she may wish to    discuss with Dr. Christell Constant the possibility of returning back to her    amitriptyline since it helped with her peripheral neuropathy and sciatic    pain a bit better than the Zoloft has. 4. Follow up with me in one to two weeks for repeat injection.  Additional physical exam revealed the patient does have increased sciatic pain on internal rotation of her right lower extremity and is tender over the right sciatic notch, raising the  question of a possible underlying piriformis muscle irritation resulting in some of her sciatica. DD:  10/29/00 TD:  10/29/00 Job: [id number]

## 2011-03-13 NOTE — Procedures (Signed)
[facility]  Patient:    Erika Mcconnell, Erika Mcconnell                    MRN: [id number] Proc. Date: 04/15/01 Adm. Date:  [id number] Attending:  Thyra Breed CC:         Genene Churn. Love, M.D.  Ollen Gross, M.D.  Monica Becton, M.D.   Procedure Report  PROCEDURE:  Selective nerve root block at L5 and S1.  INTERVAL HISTORY:  The patient has had a lot of pain into her buttock and down her leg and presents for nerve root blocks today.  We discussed these at her last visit.  PHYSICAL EXAMINATION:  Blood pressure 151/49, heart rate 73, respiratory rate 17, O2 saturations 94%.  Pain level is 8/10.  Her neuro exam is grossly unchanged from her last visit.  DESCRIPTION OF PROCEDURE:  After informed consent was obtained, the patient was placed in the prone position and monitored.  I identified the lumbar vertebrae and the S1 foramen.  The L5-S1 interspace was identified, and a mark was made 3 cm lateral to this just caudad to the transverse process, and a mark was made over the skin overlying the S1 foramen.  The area was prepped with Betadine x 3 and draped.  I anesthetized the skin with a 2 cc volume of 1% lidocaine using a 25 gauge needle.  Then 22 gauge Chiba needles were introduced into the S1 foramen and the L5-S1 foraminal aperture, confirmed by lateral and AP projections.  Aspiration was negative for blood and CSF.  I injected 1 cc of 1% lidocaine at each level and 20 mg of Medrol with 2 cc of 1% lidocaine.  The patient tolerated this well.  The needles were flushed and removed intact.  POSTPROCEDURE CONDITION:  The patient noted near total resolution in her pain.  DISCHARGE INSTRUCTIONS: 1. Resume previous diet. 2. Limitations on activities per instruction sheet. 3. Continue on current medications. 4. Follow up with me in 2-4 weeks for consideration for either repeat or    reevaluation. DD:  04/15/01 TD:  04/15/01 Job: [id number]

## 2011-03-13 NOTE — Procedures (Signed)
[facility]  Patient:    Erika Mcconnell, Erika Mcconnell                    MRN: [id number] Proc. Date: 11/12/00 Adm. Date:  [id number] Attending:  Thyra Breed CC:         Genene Churn. Love, M.D.  Monica Becton, M.D.   Procedure Report  PREOPERATIVE DIAGNOSIS:  Chronic lumbar radiculopathy with sciatica with underlying lumbar spondylosis and degenerative disk disease, status post multiple surgical interventions with underlying post-laminectomy pain syndrome.  POSTOPERATIVE DIAGNOSIS:  Chronic lumbar radiculopathy with sciatica with underlying lumbar spondylosis and degenerative disk disease, status post multiple surgical interventions with underlying post-laminectomy pain syndrome.  PROCEDURE:  Lumbar epidural steroid injection.  SURGEON:  Thyra Breed, M.D.  INTERVAL HISTORY:  The patient continues to note marked improvement.  She still has moments where it flares up pretty significantly in her back, but overall, she is very pleased with her progress.  PHYSICAL EXAMINATION:  The patient is afebrile with vital signs stable. Please see flow sheet for details.  Her back shows good healing from previous injection site.  DESCRIPTION OF PROCEDURE:  After informed consent was obtained, the patient was placed in the sitting position and monitored.  Her back was prepped with Betadine x 3.  The skin level was raised at the L3-4 interspace with 1% lidocaine.  A 20-gauge Tuohy needle was introduced into the lumbar epidural space with loss of resistance to preservative-free normal saline.  There was no CSF nor blood.  Forty milligrams of Medrol and 8 ml of preservative-free normal saline was gently injected.  The needle was flushed with preservative-free normal saline and removed intact.  POSTPROCEDURE CONDITION:  Stable.  DISCHARGE INSTRUCTIONS: 1. Resume previous diet. 2. Limitation and activities per instruction sheet as outlined by assistant    today. 3.  Continue on current medications. 4. The patient plans to follow up with Dr. Sandria Manly in the near future.  I did    advise her that if she needed a booster in May before her granddaughters    graduation, I would be willing to give her one so that she could tolerate    this, but we really rather prefer to go every six months with injections.    She does show signs of a positive response to the series. DD:  11/12/00 TD:  11/14/00 Job: [id number]

## 2011-03-13 NOTE — Procedures (Signed)
[facility]  Patient:    Erika Mcconnell, Erika Mcconnell                    MRN: [id number] Proc. Date: 11/05/00 Adm. Date:  [id number] Attending:  Thyra Breed CC:         Genene Churn. Love, M.D.  Monica Becton, M.D.   Procedure Report  PREOPERATIVE DIAGNOSIS:  Chronic lumbar radiculopathy and sciatica with underlying lumbar spondylosis and degenerative disk disease, status post multiple surgical procedures with underlying post-laminectomy pain syndrome.  POSTOPERATIVE DIAGNOSIS:  Chronic lumbar radiculopathy and sciatica with underlying lumbar spondylosis and degenerative disk disease, status post multiple surgical procedures with underlying post-laminectomy pain syndrome.  PROCEDURE:  Lumbar epidural steroid injection.  SURGEON:  Thyra Breed, M.D.  INTERVAL HISTORY:  The patient has noted a marked improvement after the first injection, although it has waned to a modest extent.  PHYSICAL EXAMINATION:  Blood pressure 141/69, heart rate 76, respiratory rate 18, and O2 saturations are 99%.  Pain level is 5 out of 10.  Her back shows good healing from previous injection site.  DESCRIPTION OF PROCEDURE:  After informed consent was obtained, the patient was placed in the sitting position and monitored.  Her back was prepped with Betadine x 3.  The skin level was raised at the L3-4 interspace with 1% lidocaine.  A 20-gauge Tuohy needle was introduced into the lumbar epidural space with loss of resistance to preservative-free normal saline.  There was no CSF nor blood.  Forty milligrams of Medrol and 8 ml of preservative-free normal saline was gently injected.  The needle was flushed with preservative-free normal saline and removed intact.  POSTPROCEDURE CONDITION:  Stable.  DISCHARGE INSTRUCTIONS: 1. Resume previous diet. 2. Limitation and activities per instruction sheet. 3. Continue on current medications. 4. Followup with me in 1-2 weeks for third  injection. DD:  11/05/00 TD:  11/06/00 Job: [id number]

## 2011-03-13 NOTE — H&P (Signed)
[facility]  Patient:    Erika Mcconnell Visit Number: [id number] MRN: [id number]          Service Type: PMG Location: [facility] Attending Physician:  Thyra Breed Adm. Date:  [id number]   CC:         Genene Churn. Love, M.D.  Monica Becton, M.D.   History and Physical  FOLLOW-UP EVALUATION  Erika Mcconnell comes in for follow-up evaluation of her chronic low back pain on the basis of lumbar spondylosis with underlying epidural scarring around the nerve roots and associated radiculopathies.  Since her last evaluation, the patient has had increased pain in both lower extremities, right greater than left, with increased burning sensations over the lateral aspect of the left ankle especially.  She notes that her husband has had a problem with a possible stroke and she had to take care of him and was very concerned about this.  She has continued to require the Roxicodone 5 mg two tablets four times a day, with her Zoloft, Premarin, Lipitor, Accupril, and insulin.  She notes that her pain is made worse by prolonged sitting or walking and improved by taking the Roxicodone.  She has no new neurologic symptoms.  It is just intensification of her previous symptoms.  CURRENT MEDICATIONS:  Roxicodone, Premarin, Lipitor, Accupril, Zoloft, and insulin.  PHYSICAL EXAMINATION:  VITAL SIGNS:  Blood pressure 144/88, heart rate is 88, respiratory rate is 19, O2 saturations 95%.  Pain level is 7/10.  EXTREMITIES/NEUROLOGIC:  Straight leg raise signs were negative.  Deep tendon reflexes were symmetric in the upper extremity.  Lower extremities demonstrated deep tendon reflexes to be hypoactive at the knees and ankles with downgoing toes.  She has attenuated pinprick over the lateral aspect of the left ankle and posterolateral calf.  IMPRESSION: 1. Chronic low back pain with acute exacerbation recently with underlying    lumbar spondylosis and epidural scarring. 2. Other  medical problems per Dr. Vernon Prey.  DISPOSITION: 1. Will add in Elavil 10 mg one p.o. q.p.m. to her current regimen to see    whether this will reduce some of the burning dysesthesias. 2. Follow up with me in two weeks, at which time will consider doing a nerve    root block. DD:  06/13/01 TD:  06/13/01 Job: [id number]

## 2011-04-01 ENCOUNTER — Encounter: Payer: Self-pay | Admitting: Physician Assistant

## 2011-04-16 ENCOUNTER — Ambulatory Visit: Payer: Self-pay | Admitting: Physical Therapy

## 2011-05-13 ENCOUNTER — Other Ambulatory Visit: Payer: Self-pay | Admitting: Neurology

## 2011-05-13 DIAGNOSIS — I10 Essential (primary) hypertension: Secondary | ICD-10-CM

## 2011-05-13 DIAGNOSIS — M48061 Spinal stenosis, lumbar region without neurogenic claudication: Secondary | ICD-10-CM

## 2011-05-13 DIAGNOSIS — G20 Parkinson's disease: Secondary | ICD-10-CM

## 2011-05-14 ENCOUNTER — Ambulatory Visit
Admission: RE | Admit: 2011-05-14 | Discharge: 2011-05-14 | Disposition: A | Discharge status: Home / Self Care | Payer: Medicare Other | Source: Ambulatory Visit | Attending: Neurology | Admitting: Neurology

## 2011-05-14 DIAGNOSIS — M48061 Spinal stenosis, lumbar region without neurogenic claudication: Secondary | ICD-10-CM

## 2011-05-14 DIAGNOSIS — G20 Parkinson's disease: Secondary | ICD-10-CM

## 2011-05-14 DIAGNOSIS — I10 Essential (primary) hypertension: Secondary | ICD-10-CM

## 2011-07-27 LAB — POCT I-STAT, CHEM 8
BUN: 40 — ABNORMAL HIGH
Calcium, Ion: 1.26
Chloride: 106
HCT: 32 — ABNORMAL LOW
Potassium: 5.2 — ABNORMAL HIGH
Sodium: 140

## 2011-07-27 LAB — BASIC METABOLIC PANEL
BUN: 38 — ABNORMAL HIGH
BUN: 41 — ABNORMAL HIGH
CO2: 22
Chloride: 103
Chloride: 107
Creatinine, Ser: 2.13 — ABNORMAL HIGH
GFR calc Af Amer: 42 — ABNORMAL LOW
GFR calc non Af Amer: 22 — ABNORMAL LOW
Glucose, Bld: 144 — ABNORMAL HIGH
Glucose, Bld: 272 — ABNORMAL HIGH
Potassium: 4.3
Potassium: 4.7
Potassium: 4.9
Sodium: 136

## 2011-07-27 LAB — CBC
HCT: 23.3 — ABNORMAL LOW
Hemoglobin: 10 — ABNORMAL LOW
MCHC: 33.4
MCHC: 34.1
MCV: 92.3
MCV: 93.8
Platelets: 180
Platelets: 225
RBC: 3.19 — ABNORMAL LOW
RDW: 12.8
RDW: 13.3
WBC: 6.7

## 2011-07-27 LAB — COMPREHENSIVE METABOLIC PANEL
AST: 22
Albumin: 3.5
Calcium: 9.6
Creatinine, Ser: 1.25 — ABNORMAL HIGH
GFR calc Af Amer: 50 — ABNORMAL LOW

## 2011-07-27 LAB — GLUCOSE, CAPILLARY
Glucose-Capillary: 113 — ABNORMAL HIGH
Glucose-Capillary: 145 — ABNORMAL HIGH
Glucose-Capillary: 148 — ABNORMAL HIGH
Glucose-Capillary: 187 — ABNORMAL HIGH
Glucose-Capillary: 251 — ABNORMAL HIGH
Glucose-Capillary: 266 — ABNORMAL HIGH
Glucose-Capillary: 81

## 2011-07-27 LAB — TYPE AND SCREEN

## 2011-07-27 LAB — URINE MICROSCOPIC-ADD ON

## 2011-07-27 LAB — PROTIME-INR
INR: 1.1
INR: 1.8 — ABNORMAL HIGH
Prothrombin Time: 15.6 — ABNORMAL HIGH

## 2011-07-27 LAB — URINALYSIS, ROUTINE W REFLEX MICROSCOPIC
Ketones, ur: NEGATIVE
Nitrite: NEGATIVE
Nitrite: POSITIVE — AB
Specific Gravity, Urine: 1.016
Specific Gravity, Urine: 1.022
Urobilinogen, UA: 0.2
Urobilinogen, UA: 0.2
pH: 5
pH: 5.5

## 2011-07-27 LAB — APTT: aPTT: 35

## 2011-07-27 LAB — URINE CULTURE: Culture: NO GROWTH

## 2011-07-27 LAB — ABO/RH: ABO/RH(D): A POS

## 2011-07-28 LAB — CBC
MCHC: 34.2
MCV: 91.7
Platelets: 169
RDW: 13.3
WBC: 9.7

## 2011-07-28 LAB — BASIC METABOLIC PANEL
BUN: 20
Chloride: 102
Creatinine, Ser: 1.1

## 2011-07-28 LAB — PROTIME-INR
INR: 2 — ABNORMAL HIGH
Prothrombin Time: 22.8 — ABNORMAL HIGH
Prothrombin Time: 24 — ABNORMAL HIGH

## 2011-07-28 LAB — GLUCOSE, CAPILLARY
Glucose-Capillary: 161 — ABNORMAL HIGH
Glucose-Capillary: 200 — ABNORMAL HIGH

## 2012-10-28 ENCOUNTER — Ambulatory Visit (HOSPITAL_COMMUNITY)
Admission: RE | Admit: 2012-10-28 | Discharge: 2012-10-28 | Disposition: A | Discharge status: Home / Self Care | Payer: Medicare Other | Source: Ambulatory Visit | Attending: Urology | Admitting: Urology

## 2012-10-28 DIAGNOSIS — N390 Urinary tract infection, site not specified: Secondary | ICD-10-CM | POA: Insufficient documentation

## 2012-10-28 MED ORDER — SODIUM CHLORIDE 0.9 % IJ SOLN: 10.0000 mL | Freq: Two times a day (BID) | INTRAMUSCULAR | Status: DC

## 2012-10-28 MED ORDER — SODIUM CHLORIDE 0.9 % IJ SOLN: 10.0000 mL | INTRAMUSCULAR | Status: DC | PRN

## 2012-12-21 ENCOUNTER — Ambulatory Visit (HOSPITAL_COMMUNITY)
Admission: RE | Admit: 2012-12-21 | Discharge: 2012-12-21 | Disposition: A | Discharge status: Home / Self Care | Payer: Medicare Other | Source: Ambulatory Visit | Attending: Urology | Admitting: Urology

## 2012-12-21 DIAGNOSIS — N390 Urinary tract infection, site not specified: Secondary | ICD-10-CM | POA: Insufficient documentation

## 2012-12-21 MED ORDER — GENTAMICIN IN SALINE 1.6-0.9 MG/ML-% IV SOLN
INTRAVENOUS | Status: AC
  Filled 2012-12-21: qty 50

## 2012-12-21 MED ORDER — GENTAMICIN SULFATE 40 MG/ML IJ SOLN
80.0000 mg | Freq: Once | INTRAVENOUS | Status: AC
  Administered 2012-12-21: 80 mg via INTRAVENOUS

## 2012-12-21 MED ORDER — SODIUM CHLORIDE 0.9 % IJ SOLN: 10.0000 mL | INTRAMUSCULAR | Status: DC | PRN

## 2012-12-21 MED ORDER — SODIUM CHLORIDE 0.9 % IJ SOLN: 10.0000 mL | Freq: Two times a day (BID) | INTRAMUSCULAR | Status: DC

## 2012-12-21 NOTE — Progress Notes (Signed)
Patient with no reaction to antibiotic. Discharged to home with copy of xray report home care instructions and card with 43cm written on it. Instructed patient and family that it is written on the xray report that it is ok to use picc. Patient to waiting area to wait for ride. Tolerated everything well.

## 2012-12-21 NOTE — Progress Notes (Signed)
Started gentamycin per dr Jerre Simon order.

## 2012-12-21 NOTE — Progress Notes (Signed)
picc line placed for home antibiotics for urinary tract infection

## 2013-01-11 ENCOUNTER — Other Ambulatory Visit: Payer: Self-pay | Admitting: Physician Assistant

## 2013-01-11 DIAGNOSIS — M549 Dorsalgia, unspecified: Secondary | ICD-10-CM

## 2013-01-11 DIAGNOSIS — F411 Generalized anxiety disorder: Secondary | ICD-10-CM

## 2013-01-11 DIAGNOSIS — G8929 Other chronic pain: Secondary | ICD-10-CM

## 2013-01-11 DIAGNOSIS — E119 Type 2 diabetes mellitus without complications: Secondary | ICD-10-CM

## 2013-01-13 ENCOUNTER — Telehealth: Payer: Self-pay | Admitting: Physician Assistant

## 2013-01-13 MED ORDER — ALPRAZOLAM 1 MG PO TABS: 1.0000 mg | ORAL_TABLET | Freq: Three times a day (TID) | ORAL | Status: DC

## 2013-01-13 MED ORDER — INSULIN NPH (HUMAN) (ISOPHANE) 100 UNIT/ML ~~LOC~~ SUSP: 60.0000 [IU] | Freq: Every day | SUBCUTANEOUS | Status: DC

## 2013-01-13 MED ORDER — INSULIN REGULAR HUMAN 100 UNIT/ML IJ SOLN: 10.0000 [IU] | Freq: Three times a day (TID) | INTRAMUSCULAR | Status: DC

## 2013-01-13 MED ORDER — HYDROCODONE-ACETAMINOPHEN 7.5-300 MG PO TABS: 1.0000 | ORAL_TABLET | Freq: Four times a day (QID) | ORAL | Status: DC | PRN

## 2013-01-13 NOTE — Telephone Encounter (Signed)
Raiford Noble, caregiver, calls again about rxs. Asks to have a message left if rxs will be ready to pick up Saturday am. Only speak with Steward Drone. She requests that the message only states that there is a package here to pick up.

## 2013-01-13 NOTE — Telephone Encounter (Signed)
Ask for Steward Drone when calling. Needs rxs on list she dropped off earlier this week. Melaina will be going into a home as soon as possible.

## 2013-01-13 NOTE — Telephone Encounter (Signed)
Left message for Steward Drone that script is ready to pickup.

## 2013-01-23 ENCOUNTER — Telehealth: Payer: Self-pay | Admitting: *Deleted

## 2013-01-23 NOTE — Telephone Encounter (Signed)
Talked with Margeaux's daughter regarding the need to schedule her mama an appt with this office asap.  she said she has two specialist appts this week and it will be next week before she can come, she will call and schedule.

## 2013-01-24 ENCOUNTER — Encounter: Payer: Self-pay | Admitting: Neurology

## 2013-01-24 ENCOUNTER — Other Ambulatory Visit: Payer: Self-pay

## 2013-01-24 MED ORDER — CLONAZEPAM 0.5 MG PO TABS: 0.5000 mg | ORAL_TABLET | Freq: Two times a day (BID) | ORAL | Status: AC | PRN

## 2013-01-24 NOTE — Telephone Encounter (Signed)
Patient calling for a refill clonazepam

## 2013-02-06 ENCOUNTER — Encounter: Payer: Self-pay | Admitting: Family Medicine

## 2013-02-06 ENCOUNTER — Ambulatory Visit (INDEPENDENT_AMBULATORY_CARE_PROVIDER_SITE_OTHER): Payer: Medicare Other | Admitting: Family Medicine

## 2013-02-06 VITALS — BP 132/62 | HR 65 | Temp 97.0°F

## 2013-02-06 DIAGNOSIS — R350 Frequency of micturition: Secondary | ICD-10-CM

## 2013-02-06 DIAGNOSIS — F32A Depression, unspecified: Secondary | ICD-10-CM | POA: Insufficient documentation

## 2013-02-06 DIAGNOSIS — IMO0002 Reserved for concepts with insufficient information to code with codable children: Secondary | ICD-10-CM

## 2013-02-06 DIAGNOSIS — E785 Hyperlipidemia, unspecified: Secondary | ICD-10-CM

## 2013-02-06 DIAGNOSIS — I10 Essential (primary) hypertension: Secondary | ICD-10-CM

## 2013-02-06 DIAGNOSIS — M5416 Radiculopathy, lumbar region: Secondary | ICD-10-CM

## 2013-02-06 DIAGNOSIS — E119 Type 2 diabetes mellitus without complications: Secondary | ICD-10-CM

## 2013-02-06 DIAGNOSIS — F3289 Other specified depressive episodes: Secondary | ICD-10-CM

## 2013-02-06 DIAGNOSIS — G8929 Other chronic pain: Secondary | ICD-10-CM | POA: Insufficient documentation

## 2013-02-06 DIAGNOSIS — F329 Major depressive disorder, single episode, unspecified: Secondary | ICD-10-CM

## 2013-02-06 DIAGNOSIS — M549 Dorsalgia, unspecified: Secondary | ICD-10-CM

## 2013-02-06 LAB — POCT CBC
Granulocyte percent: 61.4 %G (ref 37–80)
HCT, POC: 30.9 % — AB (ref 37.7–47.9)
Hemoglobin: 9.9 g/dL — AB (ref 12.2–16.2)
Lymph, poc: 2.5 (ref 0.6–3.4)
MCH, POC: 29.4 pg (ref 27–31.2)
MCHC: 32.1 g/dL (ref 31.8–35.4)
MCV: 91.6 fL (ref 80–97)
MPV: 7.3 fL (ref 0–99.8)
POC Granulocyte: 4.7 (ref 2–6.9)
POC LYMPH PERCENT: 32.1 %L (ref 10–50)
Platelet Count, POC: 257 10*3/uL (ref 142–424)
RBC: 3.4 M/uL — AB (ref 4.04–5.48)
RDW, POC: 13.4 %
WBC: 7.7 10*3/uL (ref 4.6–10.2)

## 2013-02-06 LAB — POCT URINALYSIS DIPSTICK
Bilirubin, UA: NEGATIVE
Blood, UA: NEGATIVE
Glucose, UA: NEGATIVE
Ketones, UA: NEGATIVE
Nitrite, UA: POSITIVE
Protein, UA: NEGATIVE
Spec Grav, UA: 1.02
Urobilinogen, UA: NEGATIVE
pH, UA: 5

## 2013-02-06 LAB — POCT GLYCOSYLATED HEMOGLOBIN (HGB A1C): Hemoglobin A1C: 7.8

## 2013-02-06 MED ORDER — HYDROCODONE-ACETAMINOPHEN 7.5-300 MG PO TABS: 1.0000 | ORAL_TABLET | Freq: Four times a day (QID) | ORAL | Status: DC | PRN

## 2013-02-06 NOTE — Progress Notes (Signed)
Patient ID: Erika Mcconnell, female   DOB: 21-Jul-1929, 79 y.o.   MRN: [id number] SUBJECTIVE: HPI: Patient here by family members.For FL2 form completion and evaluation for placement in a facility. Multiple medical problems as documented in problem list. Family members attest to severe deconditioning. Cannot even get out of the bed on her own accord. Poor motivation. Poor health. Family no longer able to take care of patient.  PMH/PSH: reviewed/updated in Epic  SH/FH: reviewed/updated in Epic  Allergies: reviewed/updated in Epic  Medications: reviewed/updated in Epic  Immunizations: reviewed/updated in Epic  ROS: As above in the HPI. All other systems are stable or negative.  OBJECTIVE: APPEARANCE:  Obese White Patient in no acute distress.The patient appeared well nourished and normally developed. Acyanotic. Waist:obese VITAL SIGNS:BP 132/62  Pulse 65  Temp(Src) 97 F (36.1 C) (Oral) In a wheelchair  SKIN: warm and  Dry without overt rashes, tattoos and scars  HEAD and Neck: without JVD, Head and scalp: normal Eyes:No scleral icterus. Fundi normal, eye movements normal. Ears: Auricle normal, canal normal, Tympanic membranes normal, insufflation normal. Nose: normal Throat: normal Neck & thyroid: normal  CHEST & LUNGS: Chest wall: normal Lungs: Clear  CVS: Reveals the PMI to be normally located. Regular rhythm, First and Second Heart sounds are normal,  absence of murmurs, rubs or gallops. Peripheral vasculature: Radial pulses: normal  ABDOMEN:  Appearance: normal.obese Benign,, no organomegaly, no masses, no Abdominal Aortic enlargement. No Guarding , no rebound. No Bruits. Bowel sounds: normal  RECTAL: N/A GU: N/A  EXTREMETIES: nonedematous. Both Femoral and Pedal pulses are normal.  MUSCULOSKELETAL:  Spine: decreased ROM. Chronic back pain.  NEUROLOGIC: oriented to time,place and person; Strength is normal   ASSESSMENT: Frequency of urination  - Plan: Urine culture, POCT urinalysis dipstick  DIABETES MELLITUS-TYPE II - Plan: POCT CBC, POCT glycosylated hemoglobin (Hb A1C)  HYPERTENSION - Plan: BASIC METABOLIC PANEL WITH GFR  Lumbar radicular syndrome  Back pain, chronic - Plan: Hydrocodone-Acetaminophen 7.5-300 MG TABS  Depression  HLD (hyperlipidemia) - Plan: NMR Lipoprofile with Lipids, Hepatic function panel  Physically deconditioned, poor motivation and difficult to be cared for at home. Needs a facilty placement for care.  PLAN: Orders Placed This Encounter  Procedures  . Urine culture  . BASIC METABOLIC PANEL WITH GFR  . NMR Lipoprofile with Lipids  . Hepatic function panel  . POCT urinalysis dipstick  . POCT CBC  . POCT glycosylated hemoglobin (Hb A1C)   Results for orders placed in visit on 02/06/13 (from the past 24 hour(s))  POCT URINALYSIS DIPSTICK     Status: Abnormal   Collection Time    02/06/13  2:22 PM      Result Value Range   Color, UA yellow     Clarity, UA cloudy     Glucose, UA neg     Bilirubin, UA neg     Ketones, UA neg     Spec Grav, UA 1.020     Blood, UA neg     pH, UA 5.0     Protein, UA neg     Urobilinogen, UA negative     Nitrite, UA positive     Leukocytes, UA large (3+)    POCT CBC     Status: Abnormal   Collection Time    02/06/13  3:14 PM      Result Value Range   WBC 7.7  4.6 - 10.2 K/uL   Lymph, poc 2.5  0.6 - 3.4   POC LYMPH  PERCENT 32.1  10 - 50 %L   POC Granulocyte 4.7  2 - 6.9   Granulocyte percent 61.4  37 - 80 %G   RBC 3.4 (*) 4.04 - 5.48 M/uL   Hemoglobin 9.9 (*) 12.2 - 16.2 g/dL   HCT, POC 78.2 (*) 95.6 - 47.9 %   MCV 91.6  80 - 97 fL   MCH, POC 29.4  27 - 31.2 pg   MCHC 32.1  31.8 - 35.4 g/dL   RDW, POC 21.3     Platelet Count, POC 257.0  142 - 424 K/uL   MPV 7.3  0 - 99.8 fL  POCT GLYCOSYLATED HEMOGLOBIN (HGB A1C)     Status: None   Collection Time    02/06/13  3:18 PM      Result Value Range   Hemoglobin A1C 7.8     Meds ordered this  encounter  Medications  . amLODipine (NORVASC) 5 MG tablet    Sig:   . carbidopa-levodopa (SINEMET IR) 25-100 MG per tablet    Sig:   . ONE TOUCH ULTRA TEST test strip    Sig:   . methocarbamol (ROBAXIN) 750 MG tablet    Sig:   . Hydrocodone-Acetaminophen 7.5-300 MG TABS    Sig: Take 1 tablet by mouth 4 (four) times daily as needed.    Dispense:  120 each    Refill:  0    Order Specific Question:  Supervising Provider    Answer:  Ernestina Penna [[id number]]  FL2 to be completed. Labs ordered  [employer]. Modesto Charon, M.D.

## 2013-02-06 NOTE — Progress Notes (Signed)
Received call earlier this am from Tonya at [facility]@ [phone] stating pt has been receiving IV antibiotics in which she has completed .  She sees Dr Horton Finer and has PIC line . She is on daily Macrobid.  Archie Patten stated she will be coming in today for "placement"   She also stated daughter wants her urine ck'd today and Dr Horton Finer called and gave order to get this checked.

## 2013-02-07 LAB — BASIC METABOLIC PANEL WITH GFR
BUN: 30 mg/dL — ABNORMAL HIGH (ref 6–23)
CO2: 24 mEq/L (ref 19–32)
Calcium: 9.1 mg/dL (ref 8.4–10.5)
Chloride: 105 mEq/L (ref 96–112)
Creat: 1.43 mg/dL — ABNORMAL HIGH (ref 0.50–1.10)
GFR, Est African American: 39 mL/min — ABNORMAL LOW
GFR, Est Non African American: 34 mL/min — ABNORMAL LOW
Glucose, Bld: 188 mg/dL — ABNORMAL HIGH (ref 70–99)
Potassium: 5.1 mEq/L (ref 3.5–5.3)
Sodium: 139 mEq/L (ref 135–145)

## 2013-02-07 LAB — HEPATIC FUNCTION PANEL
ALT: <8 U/L (ref 0–35)
AST: 16 U/L (ref 0–37)
Albumin: 3.7 g/dL (ref 3.5–5.2)
Alkaline Phosphatase: 65 U/L (ref 39–117)
Bilirubin, Direct: <0.1 mg/dL (ref 0.0–0.3)
Total Bilirubin: 0.3 mg/dL (ref 0.3–1.2)
Total Protein: 7 g/dL (ref 6.0–8.3)

## 2013-02-08 LAB — URINE CULTURE: Colony Count: >=100000

## 2013-02-08 LAB — NMR LIPOPROFILE WITH LIPIDS
Cholesterol, Total: 135 mg/dL (ref <200)
HDL Particle Number: 25.3 umol/L — ABNORMAL LOW (ref >=30.5)
HDL Size: 9 nm — ABNORMAL LOW (ref >=9.2)
HDL-C: 34 mg/dL — ABNORMAL LOW (ref >=40)
LDL (calc): 54 mg/dL (ref <100)
LDL Particle Number: 708 nmol/L (ref <1000)
LDL Size: 19.9 nm — ABNORMAL LOW (ref >20.5)
LP-IR Score: 64 — ABNORMAL HIGH (ref <=45)
Large HDL-P: 3.2 umol/L — ABNORMAL LOW (ref >=4.8)
Large VLDL-P: 7 nmol/L — ABNORMAL HIGH (ref <=2.7)
Small LDL Particle Number: 446 nmol/L (ref <=527)
Triglycerides: 234 mg/dL — ABNORMAL HIGH (ref <150)
VLDL Size: 52.7 nm — ABNORMAL HIGH (ref <=46.6)

## 2013-02-09 ENCOUNTER — Telehealth: Payer: Self-pay | Admitting: Family Medicine

## 2013-02-09 NOTE — Telephone Encounter (Signed)
Erika Mcconnell is pt aware

## 2013-02-09 NOTE — Telephone Encounter (Signed)
Left mess on Erika Mcconnell's number informing her that lab on urinalysis and the urine culture faxed to Dr Horton Finer fax number is [phone]

## 2013-02-10 NOTE — Progress Notes (Signed)
Quick Note:  Labs abnormal. Patient anemic, Urine grew resistant Pseudomonas. DM not at goal.  Patient was under the care of Dr Horton Finer and received antibiotics IV. Fax results Stat to Dr Horton Finer. Have patient see the Pharmacist to adjust DM meds. Will need to recheck the CBC ASAP. Or have home health draw a sample. Thanks. ______

## 2013-10-14 ENCOUNTER — Emergency Department (HOSPITAL_COMMUNITY)
Admission: EM | Admit: 2013-10-14 | Discharge: 2013-10-14 | Disposition: A | Discharge status: Home / Self Care | Payer: Medicare Other | Attending: Emergency Medicine | Admitting: Emergency Medicine

## 2013-10-14 ENCOUNTER — Encounter (HOSPITAL_COMMUNITY): Payer: Self-pay | Admitting: Emergency Medicine

## 2013-10-14 DIAGNOSIS — N189 Chronic kidney disease, unspecified: Secondary | ICD-10-CM | POA: Insufficient documentation

## 2013-10-14 DIAGNOSIS — S0990XA Unspecified injury of head, initial encounter: Secondary | ICD-10-CM | POA: Insufficient documentation

## 2013-10-14 DIAGNOSIS — E119 Type 2 diabetes mellitus without complications: Secondary | ICD-10-CM | POA: Insufficient documentation

## 2013-10-14 DIAGNOSIS — S0993XA Unspecified injury of face, initial encounter: Secondary | ICD-10-CM | POA: Insufficient documentation

## 2013-10-14 DIAGNOSIS — F3289 Other specified depressive episodes: Secondary | ICD-10-CM | POA: Insufficient documentation

## 2013-10-14 DIAGNOSIS — W050XXA Fall from non-moving wheelchair, initial encounter: Secondary | ICD-10-CM | POA: Insufficient documentation

## 2013-10-14 DIAGNOSIS — Z79899 Other long term (current) drug therapy: Secondary | ICD-10-CM | POA: Insufficient documentation

## 2013-10-14 DIAGNOSIS — Y9210 Unspecified residential institution as the place of occurrence of the external cause: Secondary | ICD-10-CM | POA: Insufficient documentation

## 2013-10-14 DIAGNOSIS — F329 Major depressive disorder, single episode, unspecified: Secondary | ICD-10-CM | POA: Insufficient documentation

## 2013-10-14 DIAGNOSIS — IMO0002 Reserved for concepts with insufficient information to code with codable children: Secondary | ICD-10-CM | POA: Insufficient documentation

## 2013-10-14 DIAGNOSIS — Y9389 Activity, other specified: Secondary | ICD-10-CM | POA: Insufficient documentation

## 2013-10-14 DIAGNOSIS — E785 Hyperlipidemia, unspecified: Secondary | ICD-10-CM | POA: Insufficient documentation

## 2013-10-14 DIAGNOSIS — Z8719 Personal history of other diseases of the digestive system: Secondary | ICD-10-CM | POA: Insufficient documentation

## 2013-10-14 DIAGNOSIS — W19XXXA Unspecified fall, initial encounter: Secondary | ICD-10-CM

## 2013-10-14 DIAGNOSIS — G4700 Insomnia, unspecified: Secondary | ICD-10-CM | POA: Insufficient documentation

## 2013-10-14 DIAGNOSIS — Z9889 Other specified postprocedural states: Secondary | ICD-10-CM | POA: Insufficient documentation

## 2013-10-14 DIAGNOSIS — I129 Hypertensive chronic kidney disease with stage 1 through stage 4 chronic kidney disease, or unspecified chronic kidney disease: Secondary | ICD-10-CM | POA: Insufficient documentation

## 2013-10-14 DIAGNOSIS — Z961 Presence of intraocular lens: Secondary | ICD-10-CM | POA: Insufficient documentation

## 2013-10-14 DIAGNOSIS — Z9181 History of falling: Secondary | ICD-10-CM | POA: Insufficient documentation

## 2013-10-14 DIAGNOSIS — F411 Generalized anxiety disorder: Secondary | ICD-10-CM | POA: Insufficient documentation

## 2013-10-14 DIAGNOSIS — Z993 Dependence on wheelchair: Secondary | ICD-10-CM | POA: Insufficient documentation

## 2013-10-14 DIAGNOSIS — Z96649 Presence of unspecified artificial hip joint: Secondary | ICD-10-CM | POA: Insufficient documentation

## 2013-10-14 DIAGNOSIS — Z8619 Personal history of other infectious and parasitic diseases: Secondary | ICD-10-CM | POA: Insufficient documentation

## 2013-10-14 DIAGNOSIS — Z794 Long term (current) use of insulin: Secondary | ICD-10-CM | POA: Insufficient documentation

## 2013-10-14 HISTORY — DX: Retention of urine, unspecified: R33.9

## 2013-10-14 HISTORY — DX: Essential (primary) hypertension: I10

## 2013-10-14 LAB — GLUCOSE, CAPILLARY
Glucose-Capillary: 55 mg/dL — ABNORMAL LOW (ref 70–99)
Glucose-Capillary: 157 mg/dL — ABNORMAL HIGH (ref 70–99)

## 2013-10-14 NOTE — ED Notes (Signed)
Pt was given 2 orange juices, 2 packs of graham crackers with peanut butter.EDP aware. EDP also aware that pt will have to wait until 8am for EMS to arrive.

## 2013-10-14 NOTE — ED Notes (Signed)
EMS here to transport pt. 

## 2013-10-14 NOTE — ED Notes (Addendum)
Pt fell at nursing home. Pt c/o lower back and buttocks pain.

## 2013-10-14 NOTE — ED Notes (Signed)
Breakfast tray provided, pt eating

## 2013-10-14 NOTE — ED Provider Notes (Addendum)
CSN: [id number]     Arrival date & time 10/14/13  0518 History   First MD Initiated Contact with Patient 10/14/13 0531     Chief Complaint  Patient presents with  . Fall   (Consider location/radiation/quality/duration/timing/severity/associated sxs/prior Treatment) HPI This is an 79 year old female with chronic neck and back pain. She has not normally ambulatory, depending on a wheelchair for mobilization. She was attempting to scoot herself off the toilet back on her wheelchair this morning when her hand slipped from the rail. She fall onto her left buttock and also struck the back of her head against a cabinet. There was no loss of consciousness. She is denying any change in her chronic neck or back pain. She is having minimal discomfort in her left buttock. She did not think she was injured enough to be transferred to the ED by her facility insisted.  Past Medical History  Diagnosis Date  . Chronic back pain   . Hyperlipidemia   . IDDM (insulin dependent diabetes mellitus)   . Postmenopausal HRT (hormone replacement therapy)   . Insomnia   . Lumbar radiculopathy   . Gastritis, Helicobacter pylori   . Diverticulosis   . CKD (chronic kidney disease)   . S/P lens implant     Implanted Contacts   . Falls frequently 3/12    balance disturbance ( referred to Nurologist )  . Anxiety   . Depression   . Hypertension   . Urinary retention    Past Surgical History  Procedure Laterality Date  . Disc surgery x3      1977, 1984, & 1995  . Total abdominal hysterectomy w/ bilateral salpingoophorectomy  1983  . Cataract surgery  1984    with lens implants   . Surgery to control back pain  3/99    Dr. Dr. Vena Rua   . Appendectomy    . Bilateral cataract surgery    . Cholecystectomy    . Dorsal column stimulator    . Tonsillectomy    . Hip replacement for arthritis  2009   History reviewed. No pertinent family history. History  Substance Use Topics  . Smoking status: Never Smoker    . Smokeless tobacco: Not on file  . Alcohol Use: No   OB History   Grav Para Term Preterm Abortions TAB SAB Ect Mult Living                 Review of Systems  All other systems reviewed and are negative.    Allergies  Amitriptyline hcl; Cortisone; Crestor; Fentanyl; Lipitor; Morphine; and Oxycodone hcl  Home Medications   Current Outpatient Rx  Name  Route  Sig  Dispense  Refill  . ALPRAZolam (XANAX) 1 MG tablet   Oral   Take 1 tablet (1 mg total) by mouth 3 (three) times daily.   90 tablet   0   . amLODipine (NORVASC) 5 MG tablet               . B Complex-C-Min-Fe-FA (HEMOCYTE PLUS PO)   Oral   Take by mouth.           . carbidopa-levodopa (SINEMET IR) 25-100 MG per tablet               . clonazePAM (KLONOPIN) 0.5 MG tablet   Oral   Take 1 tablet (0.5 mg total) by mouth 2 (two) times daily as needed for anxiety.   30 tablet   3     Please Schedule  Appt   . DULoxetine (CYMBALTA) 60 MG capsule   Oral   Take 60 mg by mouth daily. Take 2 tablets by mouth daily            . ezetimibe-simvastatin (VYTORIN) 10-40 MG per tablet   Oral   Take 1 tablet by mouth at bedtime.           . fish oil-omega-3 fatty acids 1000 MG capsule   Oral   Take 2 g by mouth 2 (two) times daily.           Marland Kitchen GLUCOSAMINE PO   Oral   Take by mouth daily.           . Hydrocodone-Acetaminophen 7.5-300 MG TABS   Oral   Take 1 tablet by mouth 4 (four) times daily as needed.   120 each   0   . insulin NPH (HUMULIN N) 100 UNIT/ML injection   Subcutaneous   Inject 60 Units into the skin daily before breakfast.   1 vial   12   . insulin regular (HUMULIN R) 100 units/mL injection   Subcutaneous   Inject 0.1 mLs (10 Units total) into the skin 3 (three) times daily before meals.   10 mL   12   . MAGNESIUM PO   Oral   Take by mouth.           . methocarbamol (ROBAXIN) 750 MG tablet               . Multiple Vitamins-Minerals (CENTRUM SILVER PO)    Oral   Take by mouth.           . Nabumetone (RELAFEN PO)   Oral   Take by mouth as directed.           . nitrofurantoin (MACRODANTIN) 50 MG capsule   Oral   Take 50 mg by mouth at bedtime.           . ONE TOUCH ULTRA TEST test strip               . oxybutynin (DITROPAN) 5 MG tablet   Oral   Take 5 mg by mouth 2 (two) times daily.           . pregabalin (LYRICA) 75 MG capsule   Oral   Take 75 mg by mouth daily.           . quinapril (ACCUPRIL) 40 MG tablet   Oral   Take 40 mg by mouth every morning.           . Trospium Chloride (SANCTURA XR) 60 MG CP24   Oral   Take by mouth at bedtime.           Marland Kitchen zolpidem (AMBIEN CR) 6.25 MG CR tablet   Oral   Take 6.25 mg by mouth at bedtime.           Marland Kitchen zolpidem (AMBIEN) 10 MG tablet   Oral   Take 10 mg by mouth at bedtime as needed.            BP 142/61  Pulse 64  Temp(Src) 98.9 F (37.2 C) (Oral)  Resp 20  Ht 5\' 8"  (1.727 m)  Wt 205 lb (92.987 kg)  BMI 31.18 kg/m2  SpO2 96%  Physical Exam General: Well-developed, well-nourished female in no acute distress; appearance consistent with age of record HENT: normocephalic; small left occipital hematoma Eyes: pupils equal, round and reactive to light; extraocular muscles intact Neck:  supple; no C-spine tenderness Heart: regular rate and rhythm Lungs: clear to auscultation bilaterally Abdomen: soft; nondistended; nontender Extremities: Arthritic changes; pulses normal; no tenderness or pain on passive range of motion Neurologic: Awake, alert and oriented; motor function intact in all extremities and symmetric; no facial droop Skin: Warm and dry Psychiatric: Normal mood and affect    ED Course  Procedures (including critical care time)  MDM  5:49 AM Able to bear weight and pivot at baseline. Denies any acute pain.  6:02 AM Patient states her sugar feels like it is low. This is a common occurrence at this time the morning. He was found to be 57  and she was given orange juice and peanut butter. She normally has a "milk shake" regimen her nursing home uses for these eventualities.   Hanley Seamen, MD 10/14/13 1610  Hanley Seamen, MD 10/14/13 [phone]

## 2013-10-14 NOTE — ED Notes (Signed)
Pt states she feels better, at 0730 will recheck sugar after breakfast. EMS will called per facility request.

## 2013-10-14 NOTE — ED Notes (Signed)
EMS to be here at 8 for transport

## 2014-12-03 ENCOUNTER — Other Ambulatory Visit (HOSPITAL_COMMUNITY)
Admission: RE | Admit: 2014-12-03 | Discharge: 2014-12-03 | Disposition: A | Discharge status: Home / Self Care | Payer: Medicare Other | Source: Ambulatory Visit | Attending: Internal Medicine | Admitting: Internal Medicine

## 2014-12-03 DIAGNOSIS — I10 Essential (primary) hypertension: Secondary | ICD-10-CM | POA: Insufficient documentation

## 2014-12-03 DIAGNOSIS — E785 Hyperlipidemia, unspecified: Secondary | ICD-10-CM | POA: Diagnosis not present

## 2014-12-03 DIAGNOSIS — E119 Type 2 diabetes mellitus without complications: Secondary | ICD-10-CM | POA: Diagnosis present

## 2014-12-03 LAB — CBC
HCT: 34.1 % — ABNORMAL LOW (ref 36.0–46.0)
HEMOGLOBIN: 11.2 g/dL — AB (ref 12.0–15.0)
MCH: 31 pg (ref 26.0–34.0)
MCHC: 32.8 g/dL (ref 30.0–36.0)
MCV: 94.5 fL (ref 78.0–100.0)
Platelets: 237 10*3/uL (ref 150–400)
RBC: 3.61 MIL/uL — ABNORMAL LOW (ref 3.87–5.11)
RDW: 13.1 % (ref 11.5–15.5)
WBC: 5.7 10*3/uL (ref 4.0–10.5)

## 2014-12-03 LAB — COMPREHENSIVE METABOLIC PANEL
ALBUMIN: 3.4 g/dL — AB (ref 3.5–5.2)
ALK PHOS: 38 U/L — AB (ref 39–117)
ALT: 5 U/L (ref 0–35)
AST: 13 U/L (ref 0–37)
Anion gap: 3 — ABNORMAL LOW (ref 5–15)
BUN: 28 mg/dL — ABNORMAL HIGH (ref 6–23)
CO2: 26 mmol/L (ref 19–32)
Calcium: 8.7 mg/dL (ref 8.4–10.5)
Chloride: 110 mmol/L (ref 96–112)
Creatinine, Ser: 1.38 mg/dL — ABNORMAL HIGH (ref 0.50–1.10)
GFR calc Af Amer: 40 mL/min — ABNORMAL LOW (ref >90)
GFR calc non Af Amer: 34 mL/min — ABNORMAL LOW (ref >90)
Glucose, Bld: 205 mg/dL — ABNORMAL HIGH (ref 70–99)
POTASSIUM: 4.8 mmol/L (ref 3.5–5.1)
SODIUM: 139 mmol/L (ref 135–145)
TOTAL PROTEIN: 6.4 g/dL (ref 6.0–8.3)
Total Bilirubin: 0.5 mg/dL (ref 0.3–1.2)

## 2015-06-22 ENCOUNTER — Other Ambulatory Visit
Admission: RE | Admit: 2015-06-22 | Discharge: 2015-06-22 | Disposition: A | Discharge status: Home / Self Care | Payer: Medicare Other | Source: Ambulatory Visit | Attending: Nurse Practitioner | Admitting: Nurse Practitioner

## 2015-06-22 DIAGNOSIS — D649 Anemia, unspecified: Secondary | ICD-10-CM | POA: Diagnosis present

## 2015-06-22 LAB — CBC
HCT: 33.5 % — ABNORMAL LOW (ref 35.0–47.0)
HEMOGLOBIN: 11.2 g/dL — AB (ref 12.0–16.0)
MCH: 30.6 pg (ref 26.0–34.0)
MCHC: 33.4 g/dL (ref 32.0–36.0)
MCV: 91.6 fL (ref 80.0–100.0)
PLATELETS: 277 10*3/uL (ref 150–440)
RBC: 3.66 MIL/uL — AB (ref 3.80–5.20)
RDW: 13.4 % (ref 11.5–14.5)
WBC: 8.2 10*3/uL (ref 3.6–11.0)

## 2016-02-13 ENCOUNTER — Encounter: Payer: Self-pay | Admitting: Gastroenterology

## 2017-01-28 ENCOUNTER — Encounter (HOSPITAL_COMMUNITY): Payer: Self-pay | Admitting: Emergency Medicine

## 2017-01-28 ENCOUNTER — Observation Stay (HOSPITAL_COMMUNITY)
Admission: EM | Admit: 2017-01-28 | Discharge: 2017-01-30 | Disposition: A | Discharge status: Skilled Nursing Facility | Payer: Medicare Other | Attending: Internal Medicine | Admitting: Internal Medicine

## 2017-01-28 ENCOUNTER — Inpatient Hospital Stay (HOSPITAL_COMMUNITY): Payer: Medicare Other

## 2017-01-28 DIAGNOSIS — N170 Acute kidney failure with tubular necrosis: Secondary | ICD-10-CM | POA: Diagnosis not present

## 2017-01-28 DIAGNOSIS — K922 Gastrointestinal hemorrhage, unspecified: Secondary | ICD-10-CM | POA: Diagnosis present

## 2017-01-28 DIAGNOSIS — Z9181 History of falling: Secondary | ICD-10-CM | POA: Diagnosis not present

## 2017-01-28 DIAGNOSIS — E1100 Type 2 diabetes mellitus with hyperosmolarity without nonketotic hyperglycemic-hyperosmolar coma (NKHHC): Secondary | ICD-10-CM

## 2017-01-28 DIAGNOSIS — E119 Type 2 diabetes mellitus without complications: Secondary | ICD-10-CM

## 2017-01-28 DIAGNOSIS — D649 Anemia, unspecified: Secondary | ICD-10-CM | POA: Diagnosis not present

## 2017-01-28 DIAGNOSIS — D72829 Elevated white blood cell count, unspecified: Secondary | ICD-10-CM

## 2017-01-28 DIAGNOSIS — Z7401 Bed confinement status: Secondary | ICD-10-CM | POA: Diagnosis not present

## 2017-01-28 DIAGNOSIS — N179 Acute kidney failure, unspecified: Secondary | ICD-10-CM | POA: Diagnosis not present

## 2017-01-28 DIAGNOSIS — F419 Anxiety disorder, unspecified: Secondary | ICD-10-CM | POA: Diagnosis not present

## 2017-01-28 DIAGNOSIS — Z66 Do not resuscitate: Secondary | ICD-10-CM | POA: Diagnosis not present

## 2017-01-28 DIAGNOSIS — E785 Hyperlipidemia, unspecified: Secondary | ICD-10-CM | POA: Diagnosis present

## 2017-01-28 DIAGNOSIS — R0602 Shortness of breath: Secondary | ICD-10-CM

## 2017-01-28 DIAGNOSIS — K254 Chronic or unspecified gastric ulcer with hemorrhage: Secondary | ICD-10-CM | POA: Diagnosis not present

## 2017-01-28 DIAGNOSIS — E7800 Pure hypercholesterolemia, unspecified: Secondary | ICD-10-CM

## 2017-01-28 DIAGNOSIS — G20 Parkinson's disease: Secondary | ICD-10-CM | POA: Insufficient documentation

## 2017-01-28 DIAGNOSIS — K625 Hemorrhage of anus and rectum: Secondary | ICD-10-CM | POA: Diagnosis not present

## 2017-01-28 DIAGNOSIS — Z7982 Long term (current) use of aspirin: Secondary | ICD-10-CM | POA: Diagnosis not present

## 2017-01-28 DIAGNOSIS — E1122 Type 2 diabetes mellitus with diabetic chronic kidney disease: Secondary | ICD-10-CM | POA: Insufficient documentation

## 2017-01-28 DIAGNOSIS — F329 Major depressive disorder, single episode, unspecified: Secondary | ICD-10-CM | POA: Diagnosis not present

## 2017-01-28 DIAGNOSIS — Z96649 Presence of unspecified artificial hip joint: Secondary | ICD-10-CM | POA: Insufficient documentation

## 2017-01-28 DIAGNOSIS — I129 Hypertensive chronic kidney disease with stage 1 through stage 4 chronic kidney disease, or unspecified chronic kidney disease: Secondary | ICD-10-CM | POA: Diagnosis not present

## 2017-01-28 DIAGNOSIS — F0280 Dementia in other diseases classified elsewhere without behavioral disturbance: Secondary | ICD-10-CM | POA: Diagnosis not present

## 2017-01-28 DIAGNOSIS — I10 Essential (primary) hypertension: Secondary | ICD-10-CM | POA: Diagnosis present

## 2017-01-28 DIAGNOSIS — Z79899 Other long term (current) drug therapy: Secondary | ICD-10-CM | POA: Insufficient documentation

## 2017-01-28 DIAGNOSIS — N189 Chronic kidney disease, unspecified: Secondary | ICD-10-CM | POA: Insufficient documentation

## 2017-01-28 DIAGNOSIS — E1111 Type 2 diabetes mellitus with ketoacidosis with coma: Secondary | ICD-10-CM | POA: Diagnosis not present

## 2017-01-28 DIAGNOSIS — Z87440 Personal history of urinary (tract) infections: Secondary | ICD-10-CM | POA: Diagnosis not present

## 2017-01-28 DIAGNOSIS — Z9049 Acquired absence of other specified parts of digestive tract: Secondary | ICD-10-CM | POA: Diagnosis not present

## 2017-01-28 DIAGNOSIS — Z794 Long term (current) use of insulin: Secondary | ICD-10-CM | POA: Diagnosis not present

## 2017-01-28 LAB — CBC
HCT: 30 % — ABNORMAL LOW (ref 36.0–46.0)
Hemoglobin: 10.2 g/dL — ABNORMAL LOW (ref 12.0–15.0)
MCH: 31.3 pg (ref 26.0–34.0)
MCHC: 34 g/dL (ref 30.0–36.0)
MCV: 92 fL (ref 78.0–100.0)
Platelets: 181 10*3/uL (ref 150–400)
RBC: 3.26 MIL/uL — AB (ref 3.87–5.11)
RDW: 12.8 % (ref 11.5–15.5)
WBC: 15.5 10*3/uL — ABNORMAL HIGH (ref 4.0–10.5)

## 2017-01-28 LAB — COMPREHENSIVE METABOLIC PANEL
AST: 12 U/L — AB (ref 15–41)
Albumin: 3.1 g/dL — ABNORMAL LOW (ref 3.5–5.0)
Alkaline Phosphatase: 31 U/L — ABNORMAL LOW (ref 38–126)
Anion gap: 8 (ref 5–15)
BUN: 60 mg/dL — AB (ref 6–20)
CHLORIDE: 106 mmol/L (ref 101–111)
CO2: 24 mmol/L (ref 22–32)
CREATININE: 2.21 mg/dL — AB (ref 0.44–1.00)
Calcium: 8.6 mg/dL — ABNORMAL LOW (ref 8.9–10.3)
GFR calc non Af Amer: 19 mL/min — ABNORMAL LOW (ref >60)
GFR, EST AFRICAN AMERICAN: 22 mL/min — AB (ref >60)
GLUCOSE: 139 mg/dL — AB (ref 65–99)
Potassium: 4.3 mmol/L (ref 3.5–5.1)
SODIUM: 138 mmol/L (ref 135–145)
Total Bilirubin: 0.5 mg/dL (ref 0.3–1.2)
Total Protein: 6.6 g/dL (ref 6.5–8.1)

## 2017-01-28 LAB — RETICULOCYTES
RBC.: 3.42 MIL/uL — AB (ref 3.87–5.11)
RETIC CT PCT: 1.6 % (ref 0.4–3.1)
Retic Count, Absolute: 54.7 10*3/uL (ref 19.0–186.0)

## 2017-01-28 LAB — IRON AND TIBC
Iron: 12 ug/dL — ABNORMAL LOW (ref 28–170)
Saturation Ratios: 6 % — ABNORMAL LOW (ref 10.4–31.8)
TIBC: 192 ug/dL — AB (ref 250–450)
UIBC: 180 ug/dL

## 2017-01-28 LAB — APTT: aPTT: 35 seconds (ref 24–36)

## 2017-01-28 LAB — TYPE AND SCREEN
ABO/RH(D): A POS
Antibody Screen: NEGATIVE

## 2017-01-28 LAB — HEMOGLOBIN AND HEMATOCRIT, BLOOD
HCT: 28.2 % — ABNORMAL LOW (ref 36.0–46.0)
HCT: 27.7 % — ABNORMAL LOW (ref 36.0–46.0)
HEMATOCRIT: 28.2 % — AB (ref 36.0–46.0)
HEMOGLOBIN: 9.6 g/dL — AB (ref 12.0–15.0)
HEMOGLOBIN: 9.5 g/dL — AB (ref 12.0–15.0)
HEMOGLOBIN: 9.6 g/dL — AB (ref 12.0–15.0)

## 2017-01-28 LAB — POC OCCULT BLOOD, ED: FECAL OCCULT BLD: POSITIVE — AB

## 2017-01-28 LAB — FERRITIN: Ferritin: 81 ng/mL (ref 11–307)

## 2017-01-28 LAB — VITAMIN B12: Vitamin B-12: 529 pg/mL (ref 180–914)

## 2017-01-28 LAB — FOLATE: Folate: 26.1 ng/mL (ref >5.9)

## 2017-01-28 LAB — GLUCOSE, CAPILLARY: Glucose-Capillary: 123 mg/dL — ABNORMAL HIGH (ref 65–99)

## 2017-01-28 LAB — CBG MONITORING, ED: Glucose-Capillary: 112 mg/dL — ABNORMAL HIGH (ref 65–99)

## 2017-01-28 LAB — PROTIME-INR
INR: 1.12
Prothrombin Time: 14.4 seconds (ref 11.4–15.2)

## 2017-01-28 MED ORDER — INSULIN GLARGINE 100 UNIT/ML ~~LOC~~ SOLN
15.0000 [IU] | Freq: Every day | SUBCUTANEOUS | Status: DC
  Administered 2017-01-28: 15 [IU] via SUBCUTANEOUS
  Filled 2017-01-28 (×4): qty 0.15

## 2017-01-28 MED ORDER — PANTOPRAZOLE SODIUM 40 MG IV SOLR
80.0000 mg | Freq: Once | INTRAVENOUS | Status: AC
  Administered 2017-01-28: 80 mg via INTRAVENOUS
  Filled 2017-01-28: qty 80

## 2017-01-28 MED ORDER — PREGABALIN 75 MG PO CAPS
75.0000 mg | ORAL_CAPSULE | Freq: Every day | ORAL | Status: DC
  Administered 2017-01-29 – 2017-01-30 (×2): 75 mg via ORAL
  Filled 2017-01-28 (×2): qty 1

## 2017-01-28 MED ORDER — SODIUM CHLORIDE 0.9 % IV SOLN
8.0000 mg/h | INTRAVENOUS | Status: DC
  Administered 2017-01-28 – 2017-01-29 (×2): 8 mg/h via INTRAVENOUS
  Filled 2017-01-28 (×8): qty 80

## 2017-01-28 MED ORDER — INSULIN ASPART 100 UNIT/ML ~~LOC~~ SOLN
0.0000 [IU] | SUBCUTANEOUS | Status: DC
  Administered 2017-01-29: 1 [IU] via SUBCUTANEOUS

## 2017-01-28 MED ORDER — DULOXETINE HCL 20 MG PO CPEP
20.0000 mg | ORAL_CAPSULE | Freq: Every day | ORAL | Status: DC
  Administered 2017-01-29 – 2017-01-30 (×2): 20 mg via ORAL
  Filled 2017-01-28 (×4): qty 1

## 2017-01-28 MED ORDER — CARBIDOPA-LEVODOPA 25-100 MG PO TABS
1.0000 | ORAL_TABLET | Freq: Three times a day (TID) | ORAL | Status: DC
  Administered 2017-01-28 – 2017-01-30 (×5): 1 via ORAL
  Filled 2017-01-28 (×7): qty 1

## 2017-01-28 MED ORDER — CLONAZEPAM 0.5 MG PO TABS: 0.5000 mg | ORAL_TABLET | Freq: Two times a day (BID) | ORAL | Status: DC | PRN

## 2017-01-28 MED ORDER — SODIUM CHLORIDE 0.9 % IV SOLN
INTRAVENOUS | Status: DC
  Administered 2017-01-28: 21:00:00 via INTRAVENOUS

## 2017-01-28 MED ORDER — ACETAMINOPHEN 325 MG PO TABS: 650.0000 mg | ORAL_TABLET | Freq: Four times a day (QID) | ORAL | Status: DC | PRN

## 2017-01-28 MED ORDER — ONDANSETRON HCL 4 MG/2ML IJ SOLN: 4.0000 mg | Freq: Four times a day (QID) | INTRAMUSCULAR | Status: DC | PRN

## 2017-01-28 MED ORDER — ALBUTEROL SULFATE (2.5 MG/3ML) 0.083% IN NEBU
2.5000 mg | INHALATION_SOLUTION | RESPIRATORY_TRACT | Status: DC | PRN
Start: 2017-01-28 — End: 2017-01-30

## 2017-01-28 MED ORDER — PANTOPRAZOLE SODIUM 40 MG IV SOLR: 40.0000 mg | Freq: Two times a day (BID) | INTRAVENOUS | Status: DC

## 2017-01-28 MED ORDER — SODIUM CHLORIDE 0.9 % IV BOLUS (SEPSIS)
1000.0000 mL | Freq: Once | INTRAVENOUS | Status: AC
Start: 2017-01-28 — End: 2017-01-28
  Administered 2017-01-28: 1000 mL via INTRAVENOUS

## 2017-01-28 NOTE — ED Provider Notes (Signed)
[facility]-EMERGENCY DEPT Provider Note   CSN: [id number] Arrival date & time: 01/28/17  1000   By signing my name below, I, Bobbie Stack, attest that this documentation has been prepared under the direction and in the presence of Eber Hong, MD. Electronically Signed: Bobbie Stack, Scribe. 01/28/17. 10:28 AM. History   Chief Complaint Chief Complaint  Patient presents with  . Rectal Bleeding   LEVEL 5 CAVEAT: Dementia The history is provided by the patient and the nursing home. No language interpreter was used.  HPI Comments: Erika Mcconnell is a 83 y.o. female with a hx of GERD, parkinson's disease, dementia, constipation, and diabetes, was brought in by ambulance, who presents to the Emergency Department complaining of blood in stool since this morning. The patient is from [facility]. Per staff: the patient was noted to have bright red blood "flowing out of her rectum" this morning. The patient is on opiates and Miralax. She currently takes ASA daily. No other anticoagulants noted. Patient denies any pain.  Past Medical History:  Diagnosis Date  . Anxiety   . Chronic back pain   . CKD (chronic kidney disease)   . Depression   . Diverticulosis   . Falls frequently 3/12   balance disturbance ( referred to Nurologist )  . Gastritis, Helicobacter pylori   . Hyperlipidemia   . Hypertension   . IDDM (insulin dependent diabetes mellitus) (HCC)   . Insomnia   . Lumbar radiculopathy   . Postmenopausal HRT (hormone replacement therapy)   . S/P lens implant    Implanted Contacts   . Urinary retention     Patient Active Problem List   Diagnosis Date Noted  . GI bleed 01/28/2017  . ARF (acute renal failure) (HCC) 01/28/2017  . Lumbar radicular syndrome 02/06/2013  . Back pain, chronic 02/06/2013  . Depression 02/06/2013  . HLD (hyperlipidemia) 02/06/2013  . DM2 (diabetes mellitus, type 2) (HCC) 02/14/2008  . Essential hypertension 02/14/2008  .  BLOOD IN STOOL, OCCULT 02/14/2008    Past Surgical History:  Procedure Laterality Date  . APPENDECTOMY    . bilateral cataract surgery    . cataract surgery  1984   with lens implants   . CHOLECYSTECTOMY    . Disc Surgery x3     1977, 1984, & 1995  . dorsal column stimulator    . hip replacement for arthritis  2009  . Surgery to control back pain  3/99   Dr. Dr. Vena Rua   . TONSILLECTOMY    . TOTAL ABDOMINAL HYSTERECTOMY W/ BILATERAL SALPINGOOPHORECTOMY  1983    OB History    No data available       Home Medications    Prior to Admission medications   Medication Sig Start Date End Date Taking? Authorizing Provider  amLODipine (NORVASC) 5 MG tablet Take 5 mg by mouth daily.  01/10/13  Yes Historical Provider, MD  aspirin 81 MG tablet Take 81 mg by mouth daily.   Yes Historical Provider, MD  carbidopa-levodopa (SINEMET IR) 25-100 MG per tablet Take 1 tablet by mouth 3 (three) times daily.  01/09/13  Yes Historical Provider, MD  Cholecalciferol (VITAMIN D-3 PO) Take 50,000 Units/day by mouth. 1 x a month   Yes Historical Provider, MD  clonazePAM (KLONOPIN) 0.5 MG tablet Take 1 tablet (0.5 mg total) by mouth 2 (two) times daily as needed for anxiety. 01/24/13  Yes Levert Feinstein, MD  denosumab (PROLIA) 60 MG/ML SOLN injection Inject 60 mg into the  skin every 6 (six) months. Administer in upper arm, thigh, or abdomen   Yes Historical Provider, MD  docusate sodium (COLACE) 100 MG capsule Take 100 mg by mouth 2 (two) times daily.   Yes Historical Provider, MD  DULoxetine (CYMBALTA) 20 MG capsule Take 20 mg by mouth daily.   Yes Historical Provider, MD  ezetimibe-simvastatin (VYTORIN) 10-20 MG tablet Take 1 tablet by mouth at bedtime.   Yes Historical Provider, MD  HYDROcodone-acetaminophen (NORCO) 7.5-325 MG tablet Take 1 tablet by mouth every 8 (eight) hours as needed for moderate pain.   Yes Historical Provider, MD  insulin glargine (LANTUS) 100 UNIT/ML injection Inject 30 Units into the  skin at bedtime.   Yes Historical Provider, MD  insulin lispro (HUMALOG) 100 UNIT/ML injection Inject 6 Units into the skin 3 (three) times daily before meals. At meals and bedtime if glucose is greater than or equal to 150   Yes Historical Provider, MD  lisinopril (PRINIVIL,ZESTRIL) 5 MG tablet Take 5 mg by mouth daily.   Yes Historical Provider, MD  meloxicam (MOBIC) 15 MG tablet Take 15 mg by mouth every other day.   Yes Historical Provider, MD  Ethelda Chick (OYSTER CALCIUM) 500 MG TABS tablet Take 500 mg of elemental calcium by mouth 3 (three) times daily.   Yes Historical Provider, MD  pantoprazole (PROTONIX) 40 MG tablet Take 40 mg by mouth daily.   Yes Historical Provider, MD  Polyethyl Glycol-Propyl Glycol (SYSTANE ULTRA OP) Apply 1 drop to eye.   Yes Historical Provider, MD  polyethylene glycol (MIRALAX / GLYCOLAX) packet Take 17 g by mouth daily.   Yes Historical Provider, MD  pregabalin (LYRICA) 75 MG capsule Take 75 mg by mouth daily.     Yes Historical Provider, MD  sitaGLIPtin (JANUVIA) 50 MG tablet Take 50 mg by mouth daily.   Yes Historical Provider, MD    Family History History reviewed. No pertinent family history.  Social History Social History  Substance Use Topics  . Smoking status: Never Smoker  . Smokeless tobacco: Never Used  . Alcohol use No     Allergies   Amitriptyline hcl; Cortisone; Crestor [rosuvastatin calcium]; Fentanyl; Lipitor [atorvastatin calcium]; Morphine; and Oxycodone hcl   Review of Systems Review of Systems  Unable to perform ROS: Dementia     Physical Exam Updated Vital Signs BP (!) 114/44   Pulse 64   Temp 99.7 F (37.6 C) (Rectal)   Resp 17   Ht  (1.626 m)   Wt 160 lb (72.6 kg)   SpO2 99%   BMI 27.46 kg/m   Physical Exam  Constitutional: She is oriented to person, place, and time. She appears well-developed and well-nourished. No distress.  HENT:  Head: Normocephalic and atraumatic.  Eyes: Conjunctivae and EOM are  normal.  Neck: Neck supple. No tracheal deviation present.  Cardiovascular: Normal rate.   Heart nl.  Pulmonary/Chest: Effort normal. No respiratory distress.  Lungs nl.  Abdominal: Soft. There is tenderness.  Mild tenderness bilaterally to the lower and mid abdomen. Very soft compartments.  Genitourinary:  Genitourinary Comments: Dark blood in the rectal wall. No hemorrhoids, fissures, or masses.  Musculoskeletal: Normal range of motion. She exhibits no edema.  No edema.  Neurological: She is alert and oriented to person, place, and time.  Skin: Skin is warm and dry.  Psychiatric: She has a normal mood and affect. Her behavior is normal.  Nursing note and vitals reviewed.    ED Treatments / Results  DIAGNOSTIC STUDIES: Oxygen Saturation is 99% on RA, normal by my interpretation.    COORDINATION OF CARE: 10:22 AM Discussed treatment plan with pt at bedside and pt agreed to plan. I will check the patient's labs.  Labs (all labs ordered are listed, but only abnormal results are displayed) Labs Reviewed  COMPREHENSIVE METABOLIC PANEL - Abnormal; Notable for the following:       Result Value   Glucose, Bld 139 (*)    BUN 60 (*)    Creatinine, Ser 2.21 (*)    Calcium 8.6 (*)    Albumin 3.1 (*)    AST 12 (*)    ALT <5 (*)    Alkaline Phosphatase 31 (*)    GFR calc non Af Amer 19 (*)    GFR calc Af Amer 22 (*)    All other components within normal limits  CBC - Abnormal; Notable for the following:    WBC 15.5 (*)    RBC 3.26 (*)    Hemoglobin 10.2 (*)    HCT 30.0 (*)    All other components within normal limits  RETICULOCYTES - Abnormal; Notable for the following:    RBC. 3.42 (*)    All other components within normal limits  POC OCCULT BLOOD, ED - Abnormal; Notable for the following:    Fecal Occult Bld POSITIVE (*)    All other components within normal limits  APTT  PROTIME-INR  VITAMIN B12  FOLATE  IRON AND TIBC  FERRITIN  HEMOGLOBIN AND HEMATOCRIT, BLOOD    HEMOGLOBIN AND HEMATOCRIT, BLOOD  HEMOGLOBIN AND HEMATOCRIT, BLOOD  HEMOGLOBIN A1C  HEMOGLOBIN AND HEMATOCRIT, BLOOD  TYPE AND SCREEN     Radiology Dg Chest Port 1 View  Result Date: 01/28/2017 CLINICAL DATA:  Shortness of breath. EXAM: PORTABLE CHEST 1 VIEW COMPARISON:  02/28/2015 FINDINGS: The cardiac silhouette remains enlarged. Lung volumes are diminished with mild central pulmonary vascular congestion. No confluent airspace opacity, overt pulmonary edema, sizable pleural effusion, or pneumothorax is identified. No acute osseous abnormality is seen. IMPRESSION: Low lung volumes with cardiomegaly and mild pulmonary vascular congestion. Electronically Signed   By: Sebastian Ache M.D.   On: 01/28/2017 12:14    Procedures Procedures (including critical care time)  Medications Ordered in ED Medications  pantoprazole (PROTONIX) 80 mg in sodium chloride 0.9 % 100 mL IVPB (not administered)  pantoprazole (PROTONIX) 80 mg in sodium chloride 0.9 % 250 mL (0.32 mg/mL) infusion (not administered)  pantoprazole (PROTONIX) injection 40 mg (not administered)  carbidopa-levodopa (SINEMET IR) 25-100 MG per tablet immediate release 1 tablet (not administered)  clonazePAM (KLONOPIN) tablet 0.5 mg (not administered)  DULoxetine (CYMBALTA) DR capsule 20 mg (not administered)  pregabalin (LYRICA) capsule 75 mg (not administered)  insulin glargine (LANTUS) injection 15 Units (not administered)  insulin aspart (novoLOG) injection 0-9 Units (not administered)  sodium chloride 0.9 % bolus 1,000 mL (1,000 mLs Intravenous New Bag/Given 01/28/17 1144)     Initial Impression / Assessment and Plan / ED Course  I have reviewed the triage vital signs and the nursing notes.  Pertinent labs & imaging results that were available during my care of the patient were reviewed by me and considered in my medical decision making (see chart for details).  The case was discussed with hospitalist who will admit Hgb and  labs reviewed Pt needs evaluation by GI Dr. Darrick Penna contacted by myself and will consult   Procedure Note:  Anoscopy  Risks benefits alternatives of the procedure given to the  patient Verbal Consent obtained Patient placed in the lateral decubitus position Anoscopy performed Findings  Gross dark blood - no hemorroids seen Patient tolerated procedure without any complaints   Final Clinical Impressions(s) / ED Diagnoses   Final diagnoses:  SOB (shortness of breath)    New Prescriptions New Prescriptions   No medications on file   I personally performed the services described in this documentation, which was scribed in my presence. The recorded information has been reviewed and is accurate.       Eber Hong, MD 01/28/17 [phone]

## 2017-01-28 NOTE — Consult Note (Signed)
Referring Provider: No ref. provider found Primary Care Physician:  Nida Boatman, MD Primary Gastroenterologist:  Dr.Kaplan (2009)  Date of Admission: 01/28/17 Date of Consultation: 01/28/17  Reason for Consultation:  GI bleed, anemia  HPI:  Erika Mcconnell is a 83 y.o. female with a past medical history of advanced dementia, generalized deconditioning and bedbound, nursing home resident who also has history of gastritis, Parkinson's, dyslipidemia, hypertension, diverticulosis, insulin-dependent type 2 diabetes. She presented tot he ER from [facility] with noted blood in stool. In the ER rectal exam/anoscopy revealed dark blood in the rectal wall without hemorrhoids, fissures, or masses; heme+ stool. Labs revealed leucocytosis (WBC 15.5), anemia with hgb initially 10.2 and declined to 9.6 three hours later. She is on daily ASA. Iron and TIBC low, ferritin is normal. INR normal. In ARF with Cr 2.21, BUN elevated at 60.  Today she is unable to answer questions due to sleepiness and dementia. Her family is at the bedside including her son, Erika Mcconnell, who is POA; also present was her daughter-in-law. They conirm the above information. They also state they're concerned about possible UTI because in and out cath at the facility was noted to be "with puss and dark" and that the patient has a history of chronic UTI due to urinary stasis.   Past Medical History:  Diagnosis Date  . Anxiety   . Chronic back pain   . CKD (chronic kidney disease)   . Depression   . Diverticulosis   . Falls frequently 3/12   balance disturbance ( referred to Nurologist )  . Gastritis, Helicobacter pylori   . Hyperlipidemia   . Hypertension   . IDDM (insulin dependent diabetes mellitus) (HCC)   . Insomnia   . Lumbar radiculopathy   . Postmenopausal HRT (hormone replacement therapy)   . S/P lens implant    Implanted Contacts   . Urinary retention     Past Surgical History:  Procedure Laterality  Date  . APPENDECTOMY    . bilateral cataract surgery    . cataract surgery  1984   with lens implants   . CHOLECYSTECTOMY    . Disc Surgery x3     1977, 1984, & 1995  . dorsal column stimulator    . hip replacement for arthritis  2009  . Surgery to control back pain  3/99   Dr. Dr. Vena Rua   . TONSILLECTOMY    . TOTAL ABDOMINAL HYSTERECTOMY W/ BILATERAL SALPINGOOPHORECTOMY  1983    Prior to Admission medications   Medication Sig Start Date End Date Taking? Authorizing Provider  amLODipine (NORVASC) 5 MG tablet Take 5 mg by mouth daily.  01/10/13  Yes Historical Provider, MD  aspirin 81 MG tablet Take 81 mg by mouth daily.   Yes Historical Provider, MD  carbidopa-levodopa (SINEMET IR) 25-100 MG per tablet Take 1 tablet by mouth 3 (three) times daily.  01/09/13  Yes Historical Provider, MD  Cholecalciferol (VITAMIN D-3 PO) Take 50,000 Units/day by mouth. 1 x a month   Yes Historical Provider, MD  clonazePAM (KLONOPIN) 0.5 MG tablet Take 1 tablet (0.5 mg total) by mouth 2 (two) times daily as needed for anxiety. 01/24/13  Yes Levert Feinstein, MD  denosumab (PROLIA) 60 MG/ML SOLN injection Inject 60 mg into the skin every 6 (six) months. Administer in upper arm, thigh, or abdomen   Yes Historical Provider, MD  docusate sodium (COLACE) 100 MG capsule Take 100 mg by mouth 2 (two) times daily.   Yes  Historical Provider, MD  DULoxetine (CYMBALTA) 20 MG capsule Take 20 mg by mouth daily.   Yes Historical Provider, MD  ezetimibe-simvastatin (VYTORIN) 10-20 MG tablet Take 1 tablet by mouth at bedtime.   Yes Historical Provider, MD  HYDROcodone-acetaminophen (NORCO) 7.5-325 MG tablet Take 1 tablet by mouth every 8 (eight) hours as needed for moderate pain.   Yes Historical Provider, MD  insulin glargine (LANTUS) 100 UNIT/ML injection Inject 30 Units into the skin at bedtime.   Yes Historical Provider, MD  insulin lispro (HUMALOG) 100 UNIT/ML injection Inject 6 Units into the skin 3 (three) times daily before  meals. At meals and bedtime if glucose is greater than or equal to 150   Yes Historical Provider, MD  lisinopril (PRINIVIL,ZESTRIL) 5 MG tablet Take 5 mg by mouth daily.   Yes Historical Provider, MD  meloxicam (MOBIC) 15 MG tablet Take 15 mg by mouth every other day.   Yes Historical Provider, MD  Ethelda Chick (OYSTER CALCIUM) 500 MG TABS tablet Take 500 mg of elemental calcium by mouth 3 (three) times daily.   Yes Historical Provider, MD  pantoprazole (PROTONIX) 40 MG tablet Take 40 mg by mouth daily.   Yes Historical Provider, MD  Polyethyl Glycol-Propyl Glycol (SYSTANE ULTRA OP) Apply 1 drop to eye.   Yes Historical Provider, MD  polyethylene glycol (MIRALAX / GLYCOLAX) packet Take 17 g by mouth daily.   Yes Historical Provider, MD  pregabalin (LYRICA) 75 MG capsule Take 75 mg by mouth daily.     Yes Historical Provider, MD  sitaGLIPtin (JANUVIA) 50 MG tablet Take 50 mg by mouth daily.   Yes Historical Provider, MD    Current Facility-Administered Medications  Medication Dose Route Frequency Provider Last Rate Last Dose  . carbidopa-levodopa (SINEMET IR) 25-100 MG per tablet immediate release 1 tablet  1 tablet Oral TID Leroy Sea, MD      . clonazePAM Scarlette Calico) tablet 0.5 mg  0.5 mg Oral BID PRN Leroy Sea, MD      . Melene Muller ON 01/29/2017] DULoxetine (CYMBALTA) DR capsule 20 mg  20 mg Oral Daily Leroy Sea, MD      . insulin aspart (novoLOG) injection 0-9 Units  0-9 Units Subcutaneous Q4H Leroy Sea, MD      . insulin glargine (LANTUS) injection 15 Units  15 Units Subcutaneous QHS Leroy Sea, MD      . pantoprazole (PROTONIX) 80 mg in sodium chloride 0.9 % 250 mL (0.32 mg/mL) infusion  8 mg/hr Intravenous Continuous Leroy Sea, MD 25 mL/hr at 01/28/17 1334 8 mg/hr at 01/28/17 1334  . [START ON 01/31/2017] pantoprazole (PROTONIX) injection 40 mg  40 mg Intravenous Q12H Leroy Sea, MD      . Melene Muller ON 01/29/2017] pregabalin (LYRICA) capsule 75 mg  75 mg Oral  Daily Leroy Sea, MD       Current Outpatient Prescriptions  Medication Sig Dispense Refill  . amLODipine (NORVASC) 5 MG tablet Take 5 mg by mouth daily.     Marland Kitchen aspirin 81 MG tablet Take 81 mg by mouth daily.    . carbidopa-levodopa (SINEMET IR) 25-100 MG per tablet Take 1 tablet by mouth 3 (three) times daily.     . Cholecalciferol (VITAMIN D-3 PO) Take 50,000 Units/day by mouth. 1 x a month    . clonazePAM (KLONOPIN) 0.5 MG tablet Take 1 tablet (0.5 mg total) by mouth 2 (two) times daily as needed for anxiety. 30 tablet 3  .  denosumab (PROLIA) 60 MG/ML SOLN injection Inject 60 mg into the skin every 6 (six) months. Administer in upper arm, thigh, or abdomen    . docusate sodium (COLACE) 100 MG capsule Take 100 mg by mouth 2 (two) times daily.    . DULoxetine (CYMBALTA) 20 MG capsule Take 20 mg by mouth daily.    Marland Kitchen ezetimibe-simvastatin (VYTORIN) 10-20 MG tablet Take 1 tablet by mouth at bedtime.    Marland Kitchen HYDROcodone-acetaminophen (NORCO) 7.5-325 MG tablet Take 1 tablet by mouth every 8 (eight) hours as needed for moderate pain.    Marland Kitchen insulin glargine (LANTUS) 100 UNIT/ML injection Inject 30 Units into the skin at bedtime.    . insulin lispro (HUMALOG) 100 UNIT/ML injection Inject 6 Units into the skin 3 (three) times daily before meals. At meals and bedtime if glucose is greater than or equal to 150    . lisinopril (PRINIVIL,ZESTRIL) 5 MG tablet Take 5 mg by mouth daily.    . meloxicam (MOBIC) 15 MG tablet Take 15 mg by mouth every other day.    Ethelda Chick (OYSTER CALCIUM) 500 MG TABS tablet Take 500 mg of elemental calcium by mouth 3 (three) times daily.    . pantoprazole (PROTONIX) 40 MG tablet Take 40 mg by mouth daily.    Bertram Gala Glycol-Propyl Glycol (SYSTANE ULTRA OP) Apply 1 drop to eye.    . polyethylene glycol (MIRALAX / GLYCOLAX) packet Take 17 g by mouth daily.    . pregabalin (LYRICA) 75 MG capsule Take 75 mg by mouth daily.      . sitaGLIPtin (JANUVIA) 50 MG tablet Take  50 mg by mouth daily.      Allergies as of 01/28/2017 - Review Complete 01/28/2017  Allergen Reaction Noted  . Amitriptyline hcl  03/13/2008  . Cortisone  04/01/2011  . Crestor [rosuvastatin calcium]  04/01/2011  . Fentanyl    . Lipitor [atorvastatin calcium]  04/01/2011  . Morphine  03/13/2008  . Oxycodone hcl  04/01/2011    History reviewed. No pertinent family history.  Social History   Social History  . Marital status: Married    Spouse name: N/A  . Number of children: N/A  . Years of education: N/A   Occupational History  . Not on file.   Social History Main Topics  . Smoking status: Never Smoker  . Smokeless tobacco: Never Used  . Alcohol use No  . Drug use: No  . Sexual activity: Not on file   Other Topics Concern  . Not on file   Social History Narrative  . No narrative on file    Review of Systems: Unable to complete due to dementia.  Physical Exam: Vital signs in last 24 hours: Temp:  [99.7 F (37.6 C)] 99.7 F (37.6 C) (04/05 1007) Pulse Rate:  [64-78] 64 (04/05 1020) Resp:  [14-18] 17 (04/05 1100) BP: (88-121)/(42-61) 114/44 (04/05 1100) SpO2:  [99 %] 99 % (04/05 1020) Weight:  [160 lb (72.6 kg)] 160 lb (72.6 kg) (04/05 1002)   General:   Sleeping, Well-developed, well-nourished, pleasantly confused Head:  Normocephalic and atraumatic. Eyes:  Sclera clear, no icterus. Conjunctiva pink. Ears:  Normal auditory acuity. Lungs:  Clear throughout to auscultation.   No wheezes, crackles, or rhonchi. No acute distress. Heart:  Regular rate and rhythm; no murmurs, clicks, rubs,  or gallops. Abdomen:  Soft and nondistended. No objective signs of abdominal pain to palpation. No masses, hepatosplenomegaly or hernias noted. Normal bowel sounds, without guarding, and without  rebound.   Rectal:  Deferred.   Msk:  Symmetrical without gross deformities. Pulses:  Normal pulses noted. Extremities:  Without clubbing or edema. Neurologic:  Confused consistent  with reports of chronic dementia. Psych:  Alert and cooperative. Normal mood and affect.  Intake/Output from previous day: No intake/output data recorded. Intake/Output this shift: No intake/output data recorded.  Lab Results:  Recent Labs  01/28/17 1017 01/28/17 1305  WBC 15.5*  --   HGB 10.2* 9.6*  HCT 30.0* 28.2*  PLT 181  --    BMET  Recent Labs  01/28/17 1017  NA 138  K 4.3  CL 106  CO2 24  GLUCOSE 139*  BUN 60*  CREATININE 2.21*  CALCIUM 8.6*   LFT  Recent Labs  01/28/17 1017  PROT 6.6  ALBUMIN 3.1*  AST 12*  ALT <5*  ALKPHOS 31*  BILITOT 0.5   PT/INR  Recent Labs  01/28/17 1017  LABPROT 14.4  INR 1.12   Hepatitis Panel No results for input(s): HEPBSAG, HCVAB, HEPAIGM, HEPBIGM in the last 72 hours. C-Diff No results for input(s): CDIFFTOX in the last 72 hours.  Studies/Results: Dg Chest Port 1 View  Result Date: 01/28/2017 CLINICAL DATA:  Shortness of breath. EXAM: PORTABLE CHEST 1 VIEW COMPARISON:  02/28/2015 FINDINGS: The cardiac silhouette remains enlarged. Lung volumes are diminished with mild central pulmonary vascular congestion. No confluent airspace opacity, overt pulmonary edema, sizable pleural effusion, or pneumothorax is identified. No acute osseous abnormality is seen. IMPRESSION: Low lung volumes with cardiomegaly and mild pulmonary vascular congestion. Electronically Signed   By: Sebastian Ache M.D.   On: 01/28/2017 12:14    Impression: 83 year old female on daily ASA who presented from the nursing home with complaints of rectal bleeding with dark blood noted on ER rectal exam, heme+. History of gastritis and diverticula. Patient has significant confusion and dementia. Unable to provide history. Exam essentially unremarkable. Her hgb has declined in the 3 hours she was in the ER. Acute renal failure with history of chronic UTIs, leucocytosis also noted. Patient is already on IV PPI. No active bleeding noted since presentation in the  ER. Vitals are stable at this time.  I had a good discusson with the patients son [facility] Erika Mcconnell) and daughter-in-law about to what extent they would like to workup the patient. At this time they prefer to hold off on endoscopic evaluation, unless something changes and the patient has lilfe-threatening (but treatable) bleeding. They are asking for a UA to check for UTI as explanation for ARF and leucocytosis in the setting of history of chronic UTIs.   Likely etiology for bleeding includes NSAID gastritis, PUD, duodenitis, duodenal ulcer, esophagitis, diverticular bleed. Less likely colon CA, hemorrhoids, fissure. We will plan to treat as if UGI irritation/erosion/ulceration for now.  Plan: 1. Continue IV PPI for now; may back off to bid PPI IV until patient alert and able to take oral medications 2. Monitor H/H closely (already ordered q 4 hours x 4) 3. Monitor for noted GI bleeding 4. Transfuse as necessary 5. UA with reflex to C&S 6. Avoid NSAIDs for now 7. Supportive measures 8. May consider endoscopic evaluation IF the family would like to pursue AND the patient is a candidate for conscious sedation at the time.   Thank you for allowing [place] to participate in the care of MERIE WULF  Wynne Dust, DNP, AGNP-C Adult & Gerontological Nurse Practitioner [facility]    LOS: 0 days  01/28/2017, 3:25 PM

## 2017-01-28 NOTE — ED Triage Notes (Signed)
PT brought in by [facility] today d/t bright red rectal bleeding per [facility] (SNF) today. PT denies any pain.

## 2017-01-28 NOTE — H&P (Signed)
TRH H&P   Patient Demographics:    Erika Mcconnell, is a 83 y.o. female  MRN: [id number]   DOB - 18-Dec-1928  Admit Date - 01/28/2017  Outpatient Primary MD for the patient is Nida Boatman, MD  Outpatient Specialists: [facility]    Patient coming from: SNF  Chief Complaint  Patient presents with  . Rectal Bleeding      HPI:    Erika Mcconnell  is a 83 y.o. female, With history of advanced dementia, generalized deconditioning and bedbound, nursing home resident who also has history of gastritis, Parkinson's, dyslipidemia, hypertension, diverticulosis, insulin-dependent type 2 diabetes mellitus was brought into the ER when nursing home staff noticed some blood in her stool. In the ER she had some bright red blood in the rectum. Her BUN was elevated and she had acute renal failure, patient is quite demented and unable to provide any further history.    Review of systems:    Unobtainable due to patient's advanced dementia, she is unreliable historian but says no to headache, chest or abdominal pain, she does not know if she is bleeding in her stool or not.   With Past History of the following :    Past Medical History:  Diagnosis Date  . Anxiety   . Chronic back pain   . CKD (chronic kidney disease)   . Depression   . Diverticulosis   . Falls frequently 3/12   balance disturbance ( referred to Nurologist )  . Gastritis, Helicobacter pylori   . Hyperlipidemia   . Hypertension   . IDDM (insulin dependent diabetes mellitus) (HCC)   . Insomnia   . Lumbar radiculopathy   . Postmenopausal HRT (hormone replacement therapy)   . S/P lens implant    Implanted Contacts   . Urinary retention       Past  Surgical History:  Procedure Laterality Date  . APPENDECTOMY    . bilateral cataract surgery    . cataract surgery  1984   with lens implants   . CHOLECYSTECTOMY    . Disc Surgery x3     1977, 1984, & 1995  . dorsal column stimulator    . hip replacement for arthritis  2009  . Surgery to control back pain  3/99   Dr. Dr. Vena Rua   . TONSILLECTOMY    .  TOTAL ABDOMINAL HYSTERECTOMY W/ BILATERAL SALPINGOOPHORECTOMY  1983      Social History:     Social History  Substance Use Topics  . Smoking status: Never Smoker  . Smokeless tobacco: Never Used  . Alcohol use No         Family History :   No family history of colon cancer   Home Medications:   Prior to Admission medications   Medication Sig Start Date End Date Taking? Authorizing Provider  amLODipine (NORVASC) 5 MG tablet Take 5 mg by mouth daily.  01/10/13  Yes Historical Provider, MD  aspirin 81 MG tablet Take 81 mg by mouth daily.   Yes Historical Provider, MD  carbidopa-levodopa (SINEMET IR) 25-100 MG per tablet Take 1 tablet by mouth 3 (three) times daily.  01/09/13  Yes Historical Provider, MD  Cholecalciferol (VITAMIN D-3 PO) Take 50,000 Units/day by mouth. 1 x a month   Yes Historical Provider, MD  clonazePAM (KLONOPIN) 0.5 MG tablet Take 1 tablet (0.5 mg total) by mouth 2 (two) times daily as needed for anxiety. 01/24/13  Yes Levert Feinstein, MD  denosumab (PROLIA) 60 MG/ML SOLN injection Inject 60 mg into the skin every 6 (six) months. Administer in upper arm, thigh, or abdomen   Yes Historical Provider, MD  docusate sodium (COLACE) 100 MG capsule Take 100 mg by mouth 2 (two) times daily.   Yes Historical Provider, MD  DULoxetine (CYMBALTA) 20 MG capsule Take 20 mg by mouth daily.   Yes Historical Provider, MD  ezetimibe-simvastatin (VYTORIN) 10-20 MG tablet Take 1 tablet by mouth at bedtime.   Yes Historical Provider, MD  HYDROcodone-acetaminophen (NORCO) 7.5-325 MG tablet Take 1 tablet by mouth every 8 (eight) hours as  needed for moderate pain.   Yes Historical Provider, MD  insulin glargine (LANTUS) 100 UNIT/ML injection Inject 30 Units into the skin at bedtime.   Yes Historical Provider, MD  insulin lispro (HUMALOG) 100 UNIT/ML injection Inject 6 Units into the skin 3 (three) times daily before meals. At meals and bedtime if glucose is greater than or equal to 150   Yes Historical Provider, MD  lisinopril (PRINIVIL,ZESTRIL) 5 MG tablet Take 5 mg by mouth daily.   Yes Historical Provider, MD  meloxicam (MOBIC) 15 MG tablet Take 15 mg by mouth every other day.   Yes Historical Provider, MD  Ethelda Chick (OYSTER CALCIUM) 500 MG TABS tablet Take 500 mg of elemental calcium by mouth 3 (three) times daily.   Yes Historical Provider, MD  pantoprazole (PROTONIX) 40 MG tablet Take 40 mg by mouth daily.   Yes Historical Provider, MD  Polyethyl Glycol-Propyl Glycol (SYSTANE ULTRA OP) Apply 1 drop to eye.   Yes Historical Provider, MD  polyethylene glycol (MIRALAX / GLYCOLAX) packet Take 17 g by mouth daily.   Yes Historical Provider, MD  pregabalin (LYRICA) 75 MG capsule Take 75 mg by mouth daily.     Yes Historical Provider, MD  sitaGLIPtin (JANUVIA) 50 MG tablet Take 50 mg by mouth daily.   Yes Historical Provider, MD     Allergies:     Allergies  Allergen Reactions  . Amitriptyline Hcl   . Cortisone   . Crestor [Rosuvastatin Calcium]   . Fentanyl     REACTION: rash  . Lipitor [Atorvastatin Calcium]   . Morphine   . Oxycodone Hcl      Physical Exam:   Vitals  Blood pressure (!) 114/44, pulse 64, temperature 99.7 F (37.6 C), temperature source Rectal, resp.  rate 17, height  (1.626 m), weight 72.6 kg (160 lb), SpO2 99 %.   1. General Elderly white female lying in bed in NAD, pleasantly demented,  2. She is awake and pleasantly confused, moves all 4 extremities by herself.  3. No apparent focal neurological deficits.  4. Ears and Eyes appear Normal, Conjunctivae clear, PERRLA. Moist Oral  Mucosa.  5. Supple Neck, No JVD, No cervical lymphadenopathy appriciated, No Carotid Bruits.  6. Symmetrical Chest wall movement, Good air movement bilaterally, CTAB.  7. RRR, No Gallops, Rubs or Murmurs, No Parasternal Heave.  8. Positive Bowel Sounds, Abdomen Soft, No tenderness, No organomegaly appriciated, No rebound -guarding or rigidity.  9.  No Cyanosis, Normal Skin Turgor, No Skin Rash or Bruise.  10. Good muscle tone,  joints appear normal , no effusions, Normal ROM.  11. No Palpable Lymph Nodes in Neck or Axillae      Data Review:    CBC  Recent Labs Lab 01/28/17 1017  WBC 15.5*  HGB 10.2*  HCT 30.0*  PLT 181  MCV 92.0  MCH 31.3  MCHC 34.0  RDW 12.8   ------------------------------------------------------------------------------------------------------------------  Chemistries   Recent Labs Lab 01/28/17 1017  NA 138  K 4.3  CL 106  CO2 24  GLUCOSE 139*  BUN 60*  CREATININE 2.21*  CALCIUM 8.6*  AST 12*  ALT <5*  ALKPHOS 31*  BILITOT 0.5   ------------------------------------------------------------------------------------------------------------------ estimated creatinine clearance is 17.9 mL/min (A) (by C-G formula based on SCr of 2.21 mg/dL (H)). ------------------------------------------------------------------------------------------------------------------ No results for input(s): TSH, T4TOTAL, T3FREE, THYROIDAB in the last 72 hours.  Invalid input(s): FREET3  Coagulation profile  Recent Labs Lab 01/28/17 1017  INR 1.12   ------------------------------------------------------------------------------------------------------------------- No results for input(s): DDIMER in the last 72 hours. -------------------------------------------------------------------------------------------------------------------  Cardiac Enzymes No results for input(s): CKMB, TROPONINI, MYOGLOBIN in the last 168 hours.  Invalid input(s):  CK ------------------------------------------------------------------------------------------------------------------ No results found for: BNP   ---------------------------------------------------------------------------------------------------------------  Urinalysis    Component Value Date/Time   COLORURINE YELLOW 07/24/2008 0809   APPEARANCEUR TURBID (A) 07/24/2008 0809   LABSPEC 1.022 07/24/2008 0809   PHURINE 5.0 07/24/2008 0809   GLUCOSEU NEGATIVE 07/24/2008 0809   HGBUR MODERATE (A) 07/24/2008 0809   BILIRUBINUR neg 02/06/2013 1422   KETONESUR TRACE (A) 07/24/2008 0809   PROTEINUR neg 02/06/2013 1422   PROTEINUR NEGATIVE 07/24/2008 0809   UROBILINOGEN negative 02/06/2013 1422   UROBILINOGEN 0.2 07/24/2008 0809   NITRITE positive 02/06/2013 1422   NITRITE NEGATIVE 07/24/2008 0809   LEUKOCYTESUR large (3+) 02/06/2013 1422    ----------------------------------------------------------------------------------------------------------------   Imaging Results:    Dg Chest Port 1 View  Result Date: 01/28/2017 CLINICAL DATA:  Shortness of breath. EXAM: PORTABLE CHEST 1 VIEW COMPARISON:  02/28/2015 FINDINGS: The cardiac silhouette remains enlarged. Lung volumes are diminished with mild central pulmonary vascular congestion. No confluent airspace opacity, overt pulmonary edema, sizable pleural effusion, or pneumothorax is identified. No acute osseous abnormality is seen. IMPRESSION: Low lung volumes with cardiomegaly and mild pulmonary vascular congestion. Electronically Signed   By: Sebastian Ache M.D.   On: 01/28/2017 12:14       Assessment & Plan:      1. GI bleed. Could be upper as she is on aspirin and BUN was elevated, H&H currently stable and she is currently hemodynamically stable, will be admitted, hold aspirin, IV PPI bolus and drip, type screen, monitor H&H closely. GI has been called by ER. Nothing by mouth except medications.  2. ARF. Due  to combination of #1 above  and being on ACE inhibitor. Hydrate, hold offending medications and monitor.  3. Dementia and Parkinson's. Continue Sinemet, at risk for delirium, supportive care, minimize narcotics and benzodiazepines.  4. DM type II. For now half home dose Lantus along with every 4 hours sliding scale. Check A1c.  5. Hypertension. Blood pressure is soft. Hold blood pressure medications and monitor.   DVT Prophylaxis SCDs    AM Labs Ordered, also please review Full Orders  Family Communication: Admission, patients condition and plan of care including tests being ordered have been discussed with the patient and daughter who indicate understanding and agree with the plan and Code Status.  Code Status DNR  Likely DC to  SNF 2-3 days  Condition GUARDED    Consults called: GI    Admission status: Inpt    Time spent in minutes : 35   Susa Raring M.D on 01/28/2017 at 12:25 PM  Between 7am to 7pm - Pager - [phone] ( page via [facility], text pages only, please mention full 10 digit call back number).  After 7pm go to [email or web address] - password [facility]  [facility] - Office  [phone]

## 2017-01-29 DIAGNOSIS — E1111 Type 2 diabetes mellitus with ketoacidosis with coma: Secondary | ICD-10-CM

## 2017-01-29 DIAGNOSIS — N170 Acute kidney failure with tubular necrosis: Secondary | ICD-10-CM | POA: Diagnosis not present

## 2017-01-29 DIAGNOSIS — K922 Gastrointestinal hemorrhage, unspecified: Secondary | ICD-10-CM | POA: Diagnosis present

## 2017-01-29 DIAGNOSIS — K254 Chronic or unspecified gastric ulcer with hemorrhage: Secondary | ICD-10-CM | POA: Diagnosis not present

## 2017-01-29 DIAGNOSIS — I10 Essential (primary) hypertension: Secondary | ICD-10-CM | POA: Diagnosis not present

## 2017-01-29 DIAGNOSIS — K625 Hemorrhage of anus and rectum: Secondary | ICD-10-CM | POA: Diagnosis not present

## 2017-01-29 LAB — CBC
HCT: 27.9 % — ABNORMAL LOW (ref 36.0–46.0)
Hemoglobin: 9.4 g/dL — ABNORMAL LOW (ref 12.0–15.0)
MCH: 30.9 pg (ref 26.0–34.0)
MCHC: 33.7 g/dL (ref 30.0–36.0)
MCV: 91.8 fL (ref 78.0–100.0)
Platelets: 178 10*3/uL (ref 150–400)
RBC: 3.04 MIL/uL — ABNORMAL LOW (ref 3.87–5.11)
RDW: 12.6 % (ref 11.5–15.5)
WBC: 10.8 10*3/uL — ABNORMAL HIGH (ref 4.0–10.5)

## 2017-01-29 LAB — GLUCOSE, CAPILLARY
GLUCOSE-CAPILLARY: 103 mg/dL — AB (ref 65–99)
GLUCOSE-CAPILLARY: 253 mg/dL — AB (ref 65–99)
GLUCOSE-CAPILLARY: 159 mg/dL — AB (ref 65–99)
Glucose-Capillary: 132 mg/dL — ABNORMAL HIGH (ref 65–99)
Glucose-Capillary: 159 mg/dL — ABNORMAL HIGH (ref 65–99)

## 2017-01-29 LAB — BASIC METABOLIC PANEL
Anion gap: 6 (ref 5–15)
BUN: 47 mg/dL — AB (ref 6–20)
CHLORIDE: 113 mmol/L — AB (ref 101–111)
CO2: 21 mmol/L — ABNORMAL LOW (ref 22–32)
CREATININE: 1.58 mg/dL — AB (ref 0.44–1.00)
Calcium: 8.2 mg/dL — ABNORMAL LOW (ref 8.9–10.3)
GFR calc Af Amer: 33 mL/min — ABNORMAL LOW (ref >60)
GFR calc non Af Amer: 28 mL/min — ABNORMAL LOW (ref >60)
Glucose, Bld: 108 mg/dL — ABNORMAL HIGH (ref 65–99)
Potassium: 4.1 mmol/L (ref 3.5–5.1)
SODIUM: 140 mmol/L (ref 135–145)

## 2017-01-29 LAB — HEMOGLOBIN A1C
HEMOGLOBIN A1C: 7.9 % — AB (ref 4.8–5.6)
Mean Plasma Glucose: 180 mg/dL

## 2017-01-29 LAB — HEMOGLOBIN AND HEMATOCRIT, BLOOD
HEMATOCRIT: 28.3 % — AB (ref 36.0–46.0)
Hemoglobin: 9.7 g/dL — ABNORMAL LOW (ref 12.0–15.0)

## 2017-01-29 LAB — MRSA PCR SCREENING: MRSA by PCR: NEGATIVE

## 2017-01-29 MED ORDER — PANTOPRAZOLE SODIUM 40 MG PO TBEC
40.0000 mg | DELAYED_RELEASE_TABLET | Freq: Two times a day (BID) | ORAL | Status: DC
  Administered 2017-01-29 – 2017-01-30 (×3): 40 mg via ORAL
  Filled 2017-01-29 (×3): qty 1

## 2017-01-29 MED ORDER — INSULIN GLARGINE 100 UNIT/ML ~~LOC~~ SOLN
10.0000 [IU] | Freq: Every day | SUBCUTANEOUS | Status: DC
  Administered 2017-01-29: 10 [IU] via SUBCUTANEOUS
  Filled 2017-01-29 (×3): qty 0.1

## 2017-01-29 MED ORDER — INSULIN ASPART 100 UNIT/ML ~~LOC~~ SOLN
0.0000 [IU] | Freq: Three times a day (TID) | SUBCUTANEOUS | Status: DC
  Administered 2017-01-29: 5 [IU] via SUBCUTANEOUS
  Administered 2017-01-29: 2 [IU] via SUBCUTANEOUS
  Administered 2017-01-30: 9 [IU] via SUBCUTANEOUS
  Administered 2017-01-30: 3 [IU] via SUBCUTANEOUS

## 2017-01-29 MED ORDER — SODIUM CHLORIDE 0.9 % IV SOLN: INTRAVENOUS | Status: AC

## 2017-01-29 NOTE — Progress Notes (Signed)
Subjective: Patient awakens to voice, smiles when smiled at. Denies pain. Otherwise generally unintelligible speech/non-meaningful conversation. Daughter at bedside.  Objective: Vital signs in last 24 hours: Temp:  [97.6 F (36.4 C)-98.2 F (36.8 C)] 98 F (36.7 C) (04/06 0500) Pulse Rate:  [54-63] 62 (04/06 0500) Resp:  [15-18] 18 (04/06 0500) BP: (120-132)/(42-47) 131/42 (04/06 0500) SpO2:  [97 %-99 %] 99 % (04/06 0500) Weight:  [178 lb 9.6 oz (81 kg)] 178 lb 9.6 oz (81 kg) (04/05 1810) Last BM Date: 01/28/17 General:   Sleeping but awakens to voice, pleasantly confused. Head:  Normocephalic and atraumatic. Eyes:  No icterus, sclera clear. Conjuctiva pink.  Heart:  S1, S2 present, no murmurs noted.  Lungs: Clear to auscultation bilaterally, without wheezing, rales, or rhonchi.  Abdomen:  Bowel sounds present, soft, non-tender, non-distended. No HSM or hernias noted. No rebound or guarding. No masses appreciated  Msk:  Symmetrical without gross deformities. Extremities:  Without clubbing or edema. Neurologic:  Alert and  oriented x4;  grossly normal neurologically. Psych:  Alert and cooperative. Smiles..  Intake/Output from previous day: No intake/output data recorded. Intake/Output this shift: No intake/output data recorded.  Lab Results:  Recent Labs  01/28/17 1017  01/28/17 2121 01/29/17 0049 01/29/17 0610  WBC 15.5*  --   --   --  10.8*  HGB 10.2*  < > 9.6* 9.7* 9.4*  HCT 30.0*  < > 27.7* 28.3* 27.9*  PLT 181  --   --   --  178  < > = values in this interval not displayed. BMET  Recent Labs  01/28/17 1017 01/29/17 0610  NA 138 140  K 4.3 4.1  CL 106 113*  CO2 24 21*  GLUCOSE 139* 108*  BUN 60* 47*  CREATININE 2.21* 1.58*  CALCIUM 8.6* 8.2*   LFT  Recent Labs  01/28/17 1017  PROT 6.6  ALBUMIN 3.1*  AST 12*  ALT <5*  ALKPHOS 31*  BILITOT 0.5   PT/INR  Recent Labs  01/28/17 1017  LABPROT 14.4  INR 1.12   Hepatitis Panel No  results for input(s): HEPBSAG, HCVAB, HEPAIGM, HEPBIGM in the last 72 hours.   Studies/Results: Dg Chest Port 1 View  Result Date: 01/28/2017 CLINICAL DATA:  Shortness of breath. EXAM: PORTABLE CHEST 1 VIEW COMPARISON:  02/28/2015 FINDINGS: The cardiac silhouette remains enlarged. Lung volumes are diminished with mild central pulmonary vascular congestion. No confluent airspace opacity, overt pulmonary edema, sizable pleural effusion, or pneumothorax is identified. No acute osseous abnormality is seen. IMPRESSION: Low lung volumes with cardiomegaly and mild pulmonary vascular congestion. Electronically Signed   By: Sebastian Ache M.D.   On: 01/28/2017 12:14    Assessment: 83 year old female on daily ASA who presented from the nursing home with complaints of rectal bleeding with dark blood noted on ER rectal exam, heme+. History of gastritis and diverticula. Patient has significant confusion and dementia. Unable to provide history. Exam in ER essentially unremarkable. Her hgb has declined in the 3 hours she was in the ER. Acute renal failure with history of chronic UTIs, leucocytosis also noted. Patient is already on IV PPI. No active bleeding noted since presentation in the ER. Vitals are stable at this time.  I had a good discusson with the patients son [facility] Harvie Heck) and daughter-in-law about to what extent they would like to workup the patient. At this time they prefer to hold off on endoscopic evaluation, unless something changes and the patient has lilfe-threatening (but  treatable) bleeding.  Likely etiology for bleeding includes NSAID gastritis, PUD, duodenitis, duodenal ulcer, esophagitis, diverticular bleed. Less likely colon CA, hemorrhoids, fissure. We will plan to treat as if UGI irritation/erosion/ulceration for now.  Today her hgb was noted to be stable in the mid-9 range through the night. No obvious recurrent GI bleed per family. UA not drawn yet. Awakens easily, remains pleasently  confused without ability to provide significant history.  Plan: 1. Monitor for obvious GI bleed 2. Follow hgb 3. Transition to oral PPI bid; discharge on the same 4. Continued supportive care, do not anticipate endoscopic evaluation at this time.   Thank you for allowing [place] to participate in the care of Erika Mcconnell  Wynne Dust, DNP, AGNP-C Adult & Gerontological Nurse Practitioner [facility]     LOS: 1 day    01/29/2017, 12:23 PM

## 2017-01-29 NOTE — Care Management Important Message (Signed)
Important Message  Patient Details  Name: [name] MRN: [id number] Date of Birth: 1929/03/09   Medicare Important Message Given:  Yes    Malcolm Metro, RN 01/29/2017, 2:42 PM

## 2017-01-29 NOTE — NC FL2 (Signed)
[place] MEDICAID FL2 LEVEL OF CARE SCREENING TOOL     IDENTIFICATION  Patient Name: Erika Mcconnell Birthdate: [birth year] Sex: female Admission Date (Current Location): 01/28/2017  [facility] and [place] Number:  [employer] and Address:  [facility],  [address], [place] [address]      Provider Number: [id number]  Attending Physician Name and Address:  Leroy Sea, MD  Relative Name and Phone Number:       Current Level of Care: Hospital Recommended Level of Care: Skilled Nursing Facility Prior Approval Number:    Date Approved/Denied:   PASRR Number:    Discharge Plan: SNF    Current Diagnoses: Patient Active Problem List   Diagnosis Date Noted  . GI bleed 01/28/2017  . ARF (acute renal failure) (HCC) 01/28/2017  . Rectal bleeding   . SOB (shortness of breath)   . Anemia   . Leukocytosis   . Lumbar radicular syndrome 02/06/2013  . Back pain, chronic 02/06/2013  . Depression 02/06/2013  . HLD (hyperlipidemia) 02/06/2013  . DM2 (diabetes mellitus, type 2) (HCC) 02/14/2008  . Essential hypertension 02/14/2008  . BLOOD IN STOOL, OCCULT 02/14/2008    Orientation RESPIRATION BLADDER Height & Weight     Self  Normal Incontinent (in and out cath) Weight: 178 lb 9.6 oz (81 kg) Height:   (172.7 cm)  BEHAVIORAL SYMPTOMS/MOOD NEUROLOGICAL BOWEL NUTRITION STATUS      Incontinent Diet (See DC summary)  AMBULATORY STATUS COMMUNICATION OF NEEDS Skin   Extensive Assist Verbally Normal                       Personal Care Assistance Level of Assistance  Bathing, Feeding, Dressing Bathing Assistance: Limited assistance Feeding assistance: Limited assistance Dressing Assistance: Limited assistance     Functional Limitations Info  Sight, Hearing, Speech Sight Info: Adequate Hearing Info: Adequate Speech Info: Adequate    SPECIAL CARE FACTORS FREQUENCY                       Contractures Contractures Info: Not  present    Additional Factors Info  Code Status, Allergies Code Status Info: DNR Allergies Info:  Amitriptyline Hcl, Cortisone, Crestor Rosuvastatin Calcium, Fentanyl, Lipitor Atorvastatin Calcium, Morphine, Oxycodone Hcl           Current Medications (01/29/2017):  This is the current hospital active medication list Current Facility-Administered Medications  Medication Dose Route Frequency Provider Last Rate Last Dose  . 0.9 %  sodium chloride infusion   Intravenous Continuous Leroy Sea, MD 75 mL/hr at 01/28/17 2104    . acetaminophen (TYLENOL) tablet 650 mg  650 mg Oral Q6H PRN Leroy Sea, MD      . albuterol (PROVENTIL) (2.5 MG/3ML) 0.083% nebulizer solution 2.5 mg  2.5 mg Nebulization Q4H PRN Leroy Sea, MD      . carbidopa-levodopa (SINEMET IR) 25-100 MG per tablet immediate release 1 tablet  1 tablet Oral TID Leroy Sea, MD   1 tablet at 01/28/17 2256  . clonazePAM (KLONOPIN) tablet 0.5 mg  0.5 mg Oral BID PRN Leroy Sea, MD      . DULoxetine (CYMBALTA) DR capsule 20 mg  20 mg Oral Daily Leroy Sea, MD      . insulin aspart (novoLOG) injection 0-9 Units  0-9 Units Subcutaneous Q4H Leroy Sea, MD   1 Units at 01/29/17 0103  . insulin glargine (LANTUS) injection  15 Units  15 Units Subcutaneous QHS Leroy Sea, MD   15 Units at 01/28/17 2256  . ondansetron (ZOFRAN) injection 4 mg  4 mg Intravenous Q6H PRN Leroy Sea, MD      . pantoprazole (PROTONIX) 80 mg in sodium chloride 0.9 % 250 mL (0.32 mg/mL) infusion  8 mg/hr Intravenous Continuous Leroy Sea, MD 25 mL/hr at 01/29/17 0108 8 mg/hr at 01/29/17 0108  . [START ON 01/31/2017] pantoprazole (PROTONIX) injection 40 mg  40 mg Intravenous Q12H Leroy Sea, MD      . pregabalin (LYRICA) capsule 75 mg  75 mg Oral Daily Leroy Sea, MD         Discharge Medications: Please see discharge summary for a list of discharge medications.  Relevant Imaging Results:  Relevant  Lab Results:   Additional Information SSN:  [id number]  Raye Sorrow, [place]

## 2017-01-29 NOTE — Progress Notes (Signed)
PROGRESS NOTE                                                                                                                                                                                                             Patient Demographics:    Erika Mcconnell, is a 83 y.o. female, DOB - 01-23-1929, UEA:[id number]  Admit date - 01/28/2017   Admitting Physician Leroy Sea, MD  Outpatient Primary MD for the patient is Nida Boatman, MD  LOS - 1  Chief Complaint  Patient presents with  . Rectal Bleeding       Brief Narrative     Erika Mcconnell  is a 83 y.o. female, With history of advanced dementia, generalized deconditioning and bedbound, nursing home resident who also Mcconnell history of gastritis, Parkinson's, dyslipidemia, hypertension, diverticulosis, insulin-dependent type 2 diabetes mellitus was brought into the ER when nursing home staff noticed some blood in her stool. In the ER she had some bright red blood in the rectum. Her BUN was elevated and she had acute renal failure, patient is quite demented and unable to provide any further history.    Subjective:    Erika Mcconnell, Erika Mcconnell, Erika Mcconnell, Erika Mcconnell, Erika Mcconnell, Erika Mcconnell.     Assessment  & Plan :   1. GI bleed. Could be upper as she is on aspirin and BUN was elevated, H&H remained stable When accounted for heme dilution due to IV fluids, she remains hemodynamically stable, continue to hold aspirin, switch PPI to oral from IV, continue to monitor H&H closely. GI on board. Currently not a candidate for procedures.  2. ARF. Due to combination of #1 above and being on ACE inhibitor. Improved with IV fluids continue.  3. Dementia and Parkinson's. Continue Sinemet, at risk for delirium, supportive care, minimize narcotics and benzodiazepines.  4. Hypertension. Blood pressure is soft.  Hold blood pressure medications and monitor.  5. DM type II. For now half home dose Lantus as PO intake is ?, along with sliding scale.   Lab Results  Component Value Date   HGBA1C 7.9 (H) 01/28/2017   CBG (last 3)   Recent Labs  01/28/17 2043 01/29/17 0016 01/29/17 0644  GLUCAP 123* 132* 103*      Diet :  DIET SOFT Room service appropriate? Yes; Fluid consistency: Thin    Family Communication  :  daughter  Code Status :  DNR  Disposition Plan  :  SNF  Consults  :  GI  Procedures  :    DVT Prophylaxis  :   SCDs    Lab Results  Component Value Date   PLT 178 01/29/2017    Inpatient Medications  Scheduled Meds: . carbidopa-levodopa  1 tablet Oral TID  . DULoxetine  20 mg Oral Daily  . insulin aspart  0-9 Units Subcutaneous TID WC  . insulin glargine  10 Units Subcutaneous QHS  . [START ON 01/31/2017] pantoprazole  40 mg Intravenous Q12H  . pregabalin  75 mg Oral Daily   Continuous Infusions: . sodium chloride    . pantoprozole (PROTONIX) infusion 8 mg/hr (01/29/17 0108)   PRN Meds:.acetaminophen, albuterol, clonazePAM, ondansetron (ZOFRAN) IV  Antibiotics  :    Anti-infectives    None         Objective:   Vitals:   01/28/17 1810 01/28/17 2046 01/28/17 2100 01/29/17 0500  BP: (!) 132/43  (!) 127/44 (!) 131/42  Pulse: 63  (!) 59 62  Resp: Temp: 97.6 F (36.4 C)  98.2 F (36.8 C) 98 F (36.7 C)  TempSrc: Oral  Oral Oral  SpO2: 97% 98% 99% 99%  Weight: 81 kg (178 lb 9.6 oz)     Height:  (1.727 m)       Wt Readings from Last 3 Encounters:  01/28/17 81 kg (178 lb 9.6 oz)  10/14/13 93 kg (205 lb)  02/14/08 84.4 kg (186 lb)    Erika intake or output data in the 24 hours ending 01/29/17 1005   Physical Exam  Awake , pleasantly confused, Erika new F.N deficits, Normal affect .AT,PERRAL Supple Neck,Erika JVD, Erika cervical lymphadenopathy appriciated.  Symmetrical Chest wall movement, Good air movement bilaterally, CTAB RRR,Erika  Gallops,Rubs or new Murmurs, Erika Parasternal Heave +ve B.Sounds, Abd Soft, Erika tenderness, Erika organomegaly appriciated, Erika rebound - guarding or rigidity. Erika Cyanosis, Clubbing or edema, Erika new Rash or bruise       Data Review:    CBC  Recent Labs Lab 01/28/17 1017 01/28/17 1305 01/28/17 1708 01/28/17 2121 01/29/17 0049 01/29/17 0610  WBC 15.5*  --   --   --   --  10.8*  HGB 10.2* 9.6* 9.5* 9.6* 9.7* 9.4*  HCT 30.0* 28.2* 28.2* 27.7* 28.3* 27.9*  PLT 181  --   --   --   --  178  MCV 92.0  --   --   --   --  91.8  MCH 31.3  --   --   --   --  30.9  MCHC 34.0  --   --   --   --  33.7  RDW 12.8  --   --   --   --  12.6    Chemistries   Recent Labs Lab 01/28/17 1017 01/29/17 0610  NA 138 140  K 4.3 4.1  CL 106 113*  CO2 24 21*  GLUCOSE 139* 108*  BUN 60* 47*  CREATININE 2.21* 1.58*  CALCIUM 8.6* 8.2*  AST 12*  --   ALT <5*  --   ALKPHOS 31*  --   BILITOT 0.5  --    ------------------------------------------------------------------------------------------------------------------ Erika results for input(s): CHOL, HDL, LDLCALC, TRIG, CHOLHDL, LDLDIRECT in the last 72 hours.  Lab Results  Component Value  Date   HGBA1C 7.9 (H) 01/28/2017   ------------------------------------------------------------------------------------------------------------------ Erika results for input(s): TSH, T4TOTAL, T3FREE, THYROIDAB in the last 72 hours.  Invalid input(s): FREET3 ------------------------------------------------------------------------------------------------------------------  Recent Labs  01/28/17 1017 01/28/17 1040  VITAMINB12  --  529  FOLATE  --  26.1  FERRITIN  --  81  TIBC  --  192*  IRON  --  12*  RETICCTPCT 1.6  --     Coagulation profile  Recent Labs Lab 01/28/17 1017  INR 1.12    Erika results for input(s): DDIMER in the last 72 hours.  Cardiac Enzymes Erika results for input(s): CKMB, TROPONINI, MYOGLOBIN in the last 168 hours.  Invalid input(s):  CK ------------------------------------------------------------------------------------------------------------------ Erika results found for: BNP  Micro Results Recent Results (from the past 240 hour(s))  MRSA PCR Screening     Status: None   Collection Time: 01/28/17 10:30 PM  Result Value Ref Range Status   MRSA by PCR NEGATIVE NEGATIVE Final    Comment:        The GeneXpert MRSA Assay (FDA approved for NASAL specimens only), is one component of a comprehensive MRSA colonization surveillance program. It is not intended to diagnose MRSA infection nor to guide or monitor treatment for MRSA infections.     Radiology Reports Dg Chest Port 1 View  Result Date: 01/28/2017 CLINICAL DATA:  Shortness of breath. EXAM: PORTABLE CHEST 1 VIEW COMPARISON:  02/28/2015 FINDINGS: The cardiac silhouette remains enlarged. Lung volumes are diminished with mild central pulmonary vascular congestion. Erika confluent airspace opacity, overt pulmonary edema, sizable pleural effusion, or pneumothorax is identified. Erika acute osseous abnormality is seen. IMPRESSION: Low lung volumes with cardiomegaly and mild pulmonary vascular congestion. Electronically Signed   By: Sebastian Ache M.D.   On: 01/28/2017 12:14    Time Spent in minutes  30   Susa Raring M.D on 01/29/2017 at 10:05 AM  Between 7am to 7pm - Pager - [phone] ( page via [facility], text pages only, please mention full 10 digit call back number).  After 7pm go to [email or web address] - password [facility]  [facility] -  Office  [phone]

## 2017-01-29 NOTE — Care Management Note (Signed)
Case Management Note  Patient Details  Name: BEVELYN ARRIOLA MRN: [id number] Date of Birth: 10/19/1929  Subjective/Objective:                  Admitted with GIB. Pt is from [facility]. CSW aware and following.   Action/Plan: Plan for return to SNF. CSW will make arrangements for return to facility at DC.   Expected Discharge Date:       01/31/2017           Expected Discharge Plan:  Skilled Nursing Facility  In-House Referral:  Clinical Social Work  Discharge planning Services  CM Consult  Post Acute Care Choice:  NA Choice offered to:  NA  Status of Service:  Completed, signed off  Malcolm Metro, RN 01/29/2017, 2:42 PM

## 2017-01-29 NOTE — Clinical Social Work Note (Addendum)
Clinical Social Work Assessment  Patient Details  Name: Erika Mcconnell MRN: [id number] Date of Birth: 1929-04-07  Date of referral:  01/29/17               Reason for consult:  Discharge Planning (From [facility])                Permission sought to share information with:  Case Manager, Magazine features editor, Family Supports Permission granted to share information::  Yes, Verbal Permission Granted  Name::        Agency::  [facility]  Relationship::  Son or daughter in Diplomatic Services operational officer Information:     Housing/Transportation Living arrangements for the past 2 months:  Skilled Building surveyor of Information:  Medical Team, Sports coach, Adult Children, Facility Patient Interpreter Needed:  None Criminal Activity/Legal Involvement Pertinent to Current Situation/Hospitalization:  No - Comment as needed Significant Relationships:  Adult Children, Other Family Members, Community Support Lives with:  Facility Resident Do you feel safe going back to the place where you live?  Yes Need for family participation in patient care:  Yes (Comment) (POA, confusion/dementia)  Care giving concerns:  NO concerns noted at this time per facility. Patient is a long term care resident at [facility]. Family  state they're concerned about possible UTI because in and out cath at the facility was noted to be "with puss and dark" and that the patient has a history of chronic UTI due to urinary stasis.   Social Worker assessment / plan:  Consult for: admitted from facility. Patient will return at discharge to [facility].  No concerns noted.  No barriers to return or known at this time.  Employment status:  Retired Database administrator, Medicaid In [place] PT Recommendations:  Not assessed at this time Information / Referral to community resources:     Patient/Family's Response to care:  Agreeable to plan  Patient/Family's Understanding of and Emotional  Response to Diagnosis, Current Treatment, and Prognosis:  Appear to understand and remain involved.  Emotional Assessment Appearance:  Appears stated age Attitude/Demeanor/Rapport:    Affect (typically observed):  Calm, Quiet Orientation:  Oriented to Self Alcohol / Substance use:  Not Applicable Psych involvement (Current and /or in the community):  No (Comment)  Discharge Needs  Concerns to be addressed:  No discharge needs identified Readmission within the last 30 days:  No Current discharge risk:  None Barriers to Discharge:  No Barriers Identified, Continued Medical Work up   Raye Sorrow, LCSW 01/29/2017, 9:05 AM

## 2017-01-30 ENCOUNTER — Telehealth: Payer: Self-pay | Admitting: *Deleted

## 2017-01-30 DIAGNOSIS — K625 Hemorrhage of anus and rectum: Secondary | ICD-10-CM | POA: Diagnosis not present

## 2017-01-30 LAB — GLUCOSE, CAPILLARY
Glucose-Capillary: 203 mg/dL — ABNORMAL HIGH (ref 65–99)
Glucose-Capillary: 355 mg/dL — ABNORMAL HIGH (ref 65–99)

## 2017-01-30 LAB — BASIC METABOLIC PANEL
Anion gap: 9 (ref 5–15)
BUN: 34 mg/dL — AB (ref 6–20)
CALCIUM: 8.5 mg/dL — AB (ref 8.9–10.3)
CO2: 20 mmol/L — AB (ref 22–32)
CREATININE: 1.41 mg/dL — AB (ref 0.44–1.00)
Chloride: 112 mmol/L — ABNORMAL HIGH (ref 101–111)
GFR calc Af Amer: 38 mL/min — ABNORMAL LOW (ref >60)
GFR calc non Af Amer: 33 mL/min — ABNORMAL LOW (ref >60)
Glucose, Bld: 208 mg/dL — ABNORMAL HIGH (ref 65–99)
Potassium: 3.7 mmol/L (ref 3.5–5.1)
Sodium: 141 mmol/L (ref 135–145)

## 2017-01-30 LAB — HEMOGLOBIN AND HEMATOCRIT, BLOOD
HEMATOCRIT: 27.6 % — AB (ref 36.0–46.0)
HEMOGLOBIN: 9.7 g/dL — AB (ref 12.0–15.0)

## 2017-01-30 MED ORDER — BOOST PLUS PO LIQD
237.0000 mL | Freq: Every day | ORAL | Status: DC
  Filled 2017-01-30 (×3): qty 237

## 2017-01-30 MED ORDER — PANTOPRAZOLE SODIUM 40 MG PO TBEC: 40.0000 mg | DELAYED_RELEASE_TABLET | Freq: Two times a day (BID) | ORAL | Status: AC

## 2017-01-30 MED ORDER — ASPIRIN 81 MG PO TABS: 81.0000 mg | ORAL_TABLET | Freq: Every day | ORAL | Status: AC

## 2017-01-30 MED ORDER — AMLODIPINE BESYLATE 10 MG PO TABS: 10.0000 mg | ORAL_TABLET | Freq: Every day | ORAL | Status: AC

## 2017-01-30 NOTE — Discharge Instructions (Signed)
Follow with Primary MD Nida Boatman, MD in 3-4 days   Get CBC, CMP, 2 view Chest X ray checked  by Primary MD or SNF MD in 3-4 days ( we routinely change or add medications that can affect your baseline labs and fluid status, therefore we recommend that you get the mentioned basic workup next visit with your PCP, your PCP may decide not to get them or add new tests based on their clinical decision)  Activity: As tolerated with Full fall precautions use walker/cane & assistance as needed  Disposition SNF  Diet:   DIET SOFT  with feeding assistance and aspiration precautions.  Accuchecks 4 times/day, Once in AM empty stomach and then before each meal. Log in all results and show them to your Prim.MD in 3 days. If any glucose reading is under 80 or above 300 call your Prim MD immidiately. Follow Low glucose instructions for glucose under 80 as instructed.   For Heart failure patients - Check your Weight same time everyday, if you gain over 2 pounds, or you develop in leg swelling, experience more shortness of breath or chest pain, call your Primary MD immediately. Follow Cardiac Low Salt Diet and 1.5 lit/day fluid restriction.  On your next visit with your primary care physician please Get Medicines reviewed and adjusted.  Please request your Prim.MD to go over all Hospital Tests and Procedure/Radiological results at the follow up, please get all Hospital records sent to your Prim MD by signing hospital release before you go home.  If you experience worsening of your admission symptoms, develop shortness of breath, life threatening emergency, suicidal or homicidal thoughts you must seek medical attention immediately by calling 911 or calling your MD immediately  if symptoms less severe.  You Must read complete instructions/literature along with all the possible adverse reactions/side effects for all the Medicines you take and that have been prescribed to you. Take any new Medicines after you  have completely understood and accpet all the possible adverse reactions/side effects.   Do not drive, operate heavy machinery, perform activities at heights, swimming or participation in water activities or provide baby sitting services if your were admitted for syncope or siezures until you have seen by Primary MD or a Neurologist and advised to do so again.  Do not drive when taking Pain medications.    Do not take more than prescribed Pain, Sleep and Anxiety Medications  Special Instructions: If you have smoked or chewed Tobacco  in the last 2 yrs please stop smoking, stop any regular Alcohol  and or any Recreational drug use.  Wear Seat belts while driving.   Please note  You were cared for by a hospitalist during your hospital stay. If you have any questions about your discharge medications or the care you received while you were in the hospital after you are discharged, you can call the unit and asked to speak with the hospitalist on call if the hospitalist that took care of you is not available. Once you are discharged, your primary care physician will handle any further medical issues. Please note that NO REFILLS for any discharge medications will be authorized once you are discharged, as it is imperative that you return to your primary care physician (or establish a relationship with a primary care physician if you do not have one) for your aftercare needs so that they can reassess your need for medications and monitor your lab values.

## 2017-01-30 NOTE — Care Management Obs Status (Signed)
MEDICARE OBSERVATION STATUS NOTIFICATION   Patient Details  Name: Erika Mcconnell MRN: [id number] Date of Birth: 31-Jul-1929   Medicare Observation Status Notification Given:       Yves Dill, RN 01/30/2017, 10:31 AM

## 2017-01-30 NOTE — Progress Notes (Signed)
Clinical Social Worker facilitated patient discharge including contacting patient family and facility to confirm patient discharge plans.  Clinical information faxed to facility and family agreeable with plan.  [facility] stated they will pick up patient from [facility] and transport her back to facility.  RN Dois Davenport to call [phone] and ask for RN, Mardella Layman report prior to discharge.  Clinical Social Worker will sign off for now as social work intervention is no longer needed. Please consult [place] again if new need arises.  Marrianne Mood, MSW, [employer] [phone]

## 2017-01-30 NOTE — Discharge Summary (Signed)
Erika Mcconnell AVW:[id number] DOB: 05/20/1929 DOA: 01/28/2017  PCP: Nida Boatman, MD  Admit date: 01/28/2017  Discharge date: 01/30/2017  Admitted From: SNF   Disposition:  SNF   Recommendations for Outpatient Follow-up:   Follow up with PCP in 1-2 weeks  PCP Please obtain BMP/CBC, 2 view CXR in 1week,  (see Discharge instructions)   PCP Please follow up on the following pending results: None   Home Health: None   Equipment/Devices: None  Consultations: GI Discharge Condition: Fair   CODE STATUS: DNR   Diet Recommendation: DIET SOFT     Chief Complaint  Patient presents with  . Rectal Bleeding     Brief history of present illness from the day of admission and additional interim summary    [name] a 83 y.o.female,With history of advanced dementia, generalized deconditioning and bedbound, nursing home resident who also has history of gastritis, Parkinson's, dyslipidemia, hypertension, diverticulosis, insulin-dependent type 2 diabetes mellitus was brought into the ER when nursing home staff noticed some blood in her stool. In the ER she had some bright red blood in the rectum. Her BUN was elevated and she had acute renal failure, patient is quite demented and unable to provide any further history.                                                                 Hospital Course    1. GI bleed. Could be upper as she is on aspirin and BUN was elevated, H&H remained stable when accounted for heme dilution due to BID from daily, was seen by GI, Currently not a candidate for procedures. Discharged back to SNF, SNF M.D. to monitor H&H, if bleeding reoccurs discontinue aspirin indefinitely. Family very reasonable and do not wish to proceed with invasive procedures or investigation at this  time.  2.ARF. Due to combination of #1 above and being on ACE inhibitor. Improved with IV fluids, discontinue ACE inhibitor, repeat BMP in a week, adjust blood pressure medications as needed.  3.Dementia and Parkinson's. Continue Sinemet, at risk for delirium, supportive care, minimize narcotics and benzodiazepines.  4.Hypertension. Blood pressure stable since ACE has been discontinued and we'll double her Norvasc dose upon discharge.  5.DM type II. continue home regimen monitor CBGs before every meal and at bedtime.    Discharge diagnosis     Principal Problem:   GI bleed Active Problems:   DM2 (diabetes mellitus, type 2) (HCC)   Essential hypertension   HLD (hyperlipidemia)   ARF (acute renal failure) (HCC)   GIB (gastrointestinal bleeding)    Discharge instructions    Discharge Instructions    Discharge instructions    Complete by:  As directed    Follow with Primary MD Nida Boatman, MD in 3-4 days   Get CBC,Erika LAVALLECMP, 2  view Chest X ray checked  by Primary MD or SNF MD in 3-4 days ( we routinely change or add medications that can affect your baseline labs and fluid status, therefore we recommend that you get the mentioned basic workup next visit with your PCP, your PCP may decide not to get them or add new tests based on their clinical decision)  Activity: As tolerated with Full fall precautions use walker/cane & assistance as needed  Disposition SNF  Diet:   DIET SOFT  with feeding assistance and aspiration precautions.  Accuchecks 4 times/day, Once in AM empty stomach and then before each meal. Log in all results and show them to your Prim.MD in 3 days. If any glucose reading is under 80 or above 300 call your Prim MD immidiately. Follow Low glucose instructions for glucose under 80 as instructed.   For Heart failure patients - Check your Weight same time everyday, if you gain over 2 pounds, or you develop in leg swelling, experience more shortness of breath  or chest pain, call your Primary MD immediately. Follow Cardiac Low Salt Diet and 1.5 lit/day fluid restriction.  On your next visit with your primary care physician please Get Medicines reviewed and adjusted.  Please request your Prim.MD to go over all Hospital Tests and Procedure/Radiological results at the follow up, please get all Hospital records sent to your Prim MD by signing hospital release before you go home.  If you experience worsening of your admission symptoms, develop shortness of breath, life threatening emergency, suicidal or homicidal thoughts you must seek medical attention immediately by calling 911 or calling your MD immediately  if symptoms less severe.  You Must read complete instructions/literature along with all the possible adverse reactions/side effects for all the Medicines you take and that have been prescribed to you. Take any new Medicines after you have completely understood and accpet all the possible adverse reactions/side effects.   Do not drive, operate heavy machinery, perform activities at heights, swimming or participation in water activities or provide baby sitting services if your were admitted for syncope or siezures until you have seen by Primary MD or a Neurologist and advised to do so again.  Do not drive when taking Pain medications.    Do not take more than prescribed Pain, Sleep and Anxiety Medications  Special Instructions: If you have smoked or chewed Tobacco  in the last 2 yrs please stop smoking, stop any regular Alcohol  and or any Recreational drug use.  Wear Seat belts while driving.   Please note  You were cared for by a hospitalist during your hospital stay. If you have any questions about your discharge medications or the care you received while you were in the hospital after you are discharged, you can call the unit and asked to speak with the hospitalist on call if the hospitalist that took care of you is not available. Once you are  discharged, your primary care physician will handle any further medical issues. Please note that NO REFILLS for any discharge medications will be authorized once you are discharged, as it is imperative that you return to your primary care physician (or establish a relationship with a primary care physician if you do not have one) for your aftercare needs so that they can reassess your need for medications and monitor your lab values.   Increase activity slowly    Complete by:  As directed       Discharge Medications   Allergies as  of 01/30/2017      Reactions   Amitriptyline Hcl    Cortisone    Crestor [rosuvastatin Calcium]    Fentanyl    REACTION: rash   Lipitor [atorvastatin Calcium]    Morphine    Oxycodone Hcl       Medication List    STOP taking these medications   lisinopril 5 MG tablet Commonly known as:  PRINIVIL,ZESTRIL   meloxicam 15 MG tablet Commonly known as:  MOBIC     TAKE these medications   amLODipine 10 MG tablet Commonly known as:  NORVASC Take 1 tablet (10 mg total) by mouth daily. What changed:  medication strength  how much to take   aspirin 81 MG tablet Take 1 tablet (81 mg total) by mouth daily. Start taking on:  02/04/2017   carbidopa-levodopa 25-100 MG tablet Commonly known as:  SINEMET IR Take 1 tablet by mouth 3 (three) times daily.   clonazePAM 0.5 MG tablet Commonly known as:  KLONOPIN Take 1 tablet (0.5 mg total) by mouth 2 (two) times daily as needed for anxiety.   denosumab 60 MG/ML Soln injection Commonly known as:  PROLIA Inject 60 mg into the skin every 6 (six) months. Administer in upper arm, thigh, or abdomen   docusate sodium 100 MG capsule Commonly known as:  COLACE Take 100 mg by mouth 2 (two) times daily.   DULoxetine 20 MG capsule Commonly known as:  CYMBALTA Take 20 mg by mouth daily.   ezetimibe-simvastatin 10-20 MG tablet Commonly known as:  VYTORIN Take 1 tablet by mouth at bedtime.     HYDROcodone-acetaminophen 7.5-325 MG tablet Commonly known as:  NORCO Take 1 tablet by mouth every 8 (eight) hours as needed for moderate pain.   insulin glargine 100 UNIT/ML injection Commonly known as:  LANTUS Inject 30 Units into the skin at bedtime.   insulin lispro 100 UNIT/ML injection Commonly known as:  HUMALOG Inject 6 Units into the skin 3 (three) times daily before meals. At meals and bedtime if glucose is greater than or equal to 150   oyster calcium 500 MG Tabs tablet Take 500 mg of elemental calcium by mouth 3 (three) times daily.   pantoprazole 40 MG tablet Commonly known as:  PROTONIX Take 1 tablet (40 mg total) by mouth 2 (two) times daily. What changed:  when to take this   polyethylene glycol packet Commonly known as:  MIRALAX / GLYCOLAX Take 17 g by mouth daily.   pregabalin 75 MG capsule Commonly known as:  LYRICA Take 75 mg by mouth daily.   sitaGLIPtin 50 MG tablet Commonly known as:  JANUVIA Take 50 mg by mouth daily.   SYSTANE ULTRA OP Apply 1 drop to eye.   VITAMIN D-3 PO Take 50,000 Units/day by mouth. 1 x a month       Follow-up Information    Nida Boatman, MD. Schedule an appointment as soon as possible for a visit in 1 day(s).   Specialty:  Internal Medicine Contact information: [address] [place] [address] [phone]           Major procedures and Radiology Reports - PLEASE review detailed and final reports thoroughly  -         Dg Chest Port 1 View  Result Date: 01/28/2017 CLINICAL DATA:  Shortness of breath. EXAM: PORTABLE CHEST 1 VIEW COMPARISON:  02/28/2015 FINDINGS: The cardiac silhouette remains enlarged. Lung volumes are diminished with mild central pulmonary vascular congestion. No confluent  airspace opacity, overt pulmonary edema, sizable pleural effusion, or pneumothorax is identified. No acute osseous abnormality is seen. IMPRESSION: Low lung volumes with cardiomegaly and mild pulmonary vascular  congestion. Electronically Signed   By: Sebastian Ache M.D.   On: 01/28/2017 12:14    Micro Results     Recent Results (from the past 240 hour(s))  MRSA PCR Screening     Status: None   Collection Time: 01/28/17 10:30 PM  Result Value Ref Range Status   MRSA by PCR NEGATIVE NEGATIVE Final    Comment:        The GeneXpert MRSA Assay (FDA approved for NASAL specimens only), is one component of a comprehensive MRSA colonization surveillance program. It is not intended to diagnose MRSA infection nor to guide or monitor treatment for MRSA infections.     Today   Subjective    Erika Mcconnell today has no headache,no chest abdominal pain,no new weakness tingling or numbness, feels much better, Remains to be unreliable historian.   Objective   Blood pressure (!) 149/47, pulse (!) 59, temperature 98 F (36.7 C), temperature source Oral, resp. rate 20, height  (1.727 m), weight 81 kg (178 lb 9.6 oz), SpO2 100 %.   Intake/Output Summary (Last 24 hours) at 01/30/17 0843 Last data filed at 01/29/17 1900  Gross per 24 hour  Intake          1297.08 ml  Output                0 ml  Net          1297.08 ml    Exam Awake , Pleasantly confused, No new F.N deficits, Normal affect [place].AT,PERRAL Supple Neck,No JVD, No cervical lymphadenopathy appriciated.  Symmetrical Chest wall movement, Good air movement bilaterally, CTAB RRR,No Gallops,Rubs or new Murmurs, No Parasternal Heave +ve B.Sounds, Abd Soft, Non tender, No organomegaly appriciated, No rebound -guarding or rigidity. No Cyanosis, Clubbing or edema, No new Rash or bruise   Data Review   CBC w Diff: Lab Results  Component Value Date   WBC 10.8 (H) 01/29/2017   HGB 9.7 (L) 01/30/2017   HCT 27.6 (L) 01/30/2017   PLT 178 01/29/2017    CMP: Lab Results  Component Value Date   NA 141 01/30/2017   K 3.7 01/30/2017   CL 112 (H) 01/30/2017   CO2 20 (L) 01/30/2017   BUN 34 (H) 01/30/2017   CREATININE 1.41 (H)  01/30/2017   CREATININE 1.43 (H) 02/06/2013   PROT 6.6 01/28/2017   ALBUMIN 3.1 (L) 01/28/2017   BILITOT 0.5 01/28/2017   ALKPHOS 31 (L) 01/28/2017   AST 12 (L) 01/28/2017   ALT <5 (L) 01/28/2017  .   Total Time in preparing paper work, data evaluation and todays exam - 35 minutes  Susa Raring M.D on 01/30/2017 at 8:43 AM  [facility]   Office  [phone]

## 2017-01-30 NOTE — Care Management CC44 (Signed)
Condition Code 44 Documentation Completed  Patient Details  Name: Erika Mcconnell MRN: [id number] Date of Birth: 05/10/1929   Condition Code 44 given:  Yes Patient signature on Condition Code 44 notice:   (explained to pt's daughter Steward Drone and mailed to address of pt) Documentation of 2 MD's agreement:  Yes Code 44 added to claim:  Yes    Yves Dill, RN 01/30/2017, 10:32 AM

## 2017-01-30 NOTE — Progress Notes (Signed)
Report called to Tancred at [facility] and discharge plans and instructions reviewed w/pt's daughter.  [facility] is sending a tranport.

## 2017-02-01 NOTE — Care Management CC44 (Signed)
Condition Code 44 Documentation Completed  Patient Details  Name: LAYSA KIMMEY MRN: [id number] Date of Birth: 1929-10-04   Condition Code 44 given:  Yes Patient signature on Condition Code 44 notice:   (explained to pt's daughter Steward Drone and mailed to address of pt) Documentation of 2 MD's agreement:  Yes Code 44 added to claim:  Yes    Malcolm Metro, RN 02/01/2017, 9:01 AM

## 2017-02-01 NOTE — Care Management Obs Status (Signed)
MEDICARE OBSERVATION STATUS NOTIFICATION   Patient Details  Name: TASHAUNA CAISSE MRN: [id number] Date of Birth: [birth year]   Medicare Observation Status Notification Given:  Yes    Malcolm Metro, RN 02/01/2017, 9:01 AM

## 2017-07-25 ENCOUNTER — Emergency Department (HOSPITAL_COMMUNITY): Payer: Medicare Other

## 2017-07-25 ENCOUNTER — Emergency Department (HOSPITAL_COMMUNITY)
Admission: EM | Admit: 2017-07-25 | Discharge: 2017-07-25 | Disposition: A | Discharge status: Home / Self Care | Payer: Medicare Other | Attending: Emergency Medicine | Admitting: Emergency Medicine

## 2017-07-25 ENCOUNTER — Encounter (HOSPITAL_COMMUNITY): Payer: Self-pay | Admitting: *Deleted

## 2017-07-25 DIAGNOSIS — I129 Hypertensive chronic kidney disease with stage 1 through stage 4 chronic kidney disease, or unspecified chronic kidney disease: Secondary | ICD-10-CM | POA: Diagnosis not present

## 2017-07-25 DIAGNOSIS — G20 Parkinson's disease: Secondary | ICD-10-CM | POA: Diagnosis not present

## 2017-07-25 DIAGNOSIS — Y999 Unspecified external cause status: Secondary | ICD-10-CM | POA: Insufficient documentation

## 2017-07-25 DIAGNOSIS — S0181XA Laceration without foreign body of other part of head, initial encounter: Secondary | ICD-10-CM | POA: Diagnosis not present

## 2017-07-25 DIAGNOSIS — Y939 Activity, unspecified: Secondary | ICD-10-CM | POA: Diagnosis not present

## 2017-07-25 DIAGNOSIS — S0990XA Unspecified injury of head, initial encounter: Secondary | ICD-10-CM

## 2017-07-25 DIAGNOSIS — E1122 Type 2 diabetes mellitus with diabetic chronic kidney disease: Secondary | ICD-10-CM | POA: Insufficient documentation

## 2017-07-25 DIAGNOSIS — Z79899 Other long term (current) drug therapy: Secondary | ICD-10-CM | POA: Diagnosis not present

## 2017-07-25 DIAGNOSIS — Y92129 Unspecified place in nursing home as the place of occurrence of the external cause: Secondary | ICD-10-CM | POA: Insufficient documentation

## 2017-07-25 DIAGNOSIS — F0390 Unspecified dementia without behavioral disturbance: Secondary | ICD-10-CM | POA: Insufficient documentation

## 2017-07-25 DIAGNOSIS — Z7984 Long term (current) use of oral hypoglycemic drugs: Secondary | ICD-10-CM | POA: Diagnosis not present

## 2017-07-25 DIAGNOSIS — N189 Chronic kidney disease, unspecified: Secondary | ICD-10-CM | POA: Insufficient documentation

## 2017-07-25 DIAGNOSIS — W07XXXA Fall from chair, initial encounter: Secondary | ICD-10-CM | POA: Diagnosis not present

## 2017-07-25 HISTORY — DX: Gastrointestinal hemorrhage, unspecified: K92.2

## 2017-07-25 HISTORY — DX: Atherosclerotic heart disease of native coronary artery without angina pectoris: I25.10

## 2017-07-25 HISTORY — DX: Unspecified convulsions: R56.9

## 2017-07-25 HISTORY — DX: Weakness: R53.1

## 2017-07-25 HISTORY — DX: Diverticulitis of intestine, part unspecified, without perforation or abscess without bleeding: K57.92

## 2017-07-25 HISTORY — DX: Restless legs syndrome: G25.81

## 2017-07-25 HISTORY — DX: Cognitive communication deficit: R41.841

## 2017-07-25 HISTORY — DX: Hyperlipidemia, unspecified: E78.5

## 2017-07-25 HISTORY — DX: Age-related osteoporosis without current pathological fracture: M81.0

## 2017-07-25 HISTORY — DX: Parkinson's disease: G20

## 2017-07-25 HISTORY — DX: Unspecified dementia, unspecified severity, without behavioral disturbance, psychotic disturbance, mood disturbance, and anxiety: F03.90

## 2017-07-25 HISTORY — DX: Dysphagia, unspecified: R13.10

## 2017-07-25 LAB — CBG MONITORING, ED: Glucose-Capillary: 254 mg/dL — ABNORMAL HIGH (ref 65–99)

## 2017-07-25 MED ORDER — LIDOCAINE-EPINEPHRINE (PF) 1 %-1:200000 IJ SOLN
20.0000 mL | Freq: Once | INTRAMUSCULAR | Status: DC
  Filled 2017-07-25: qty 30

## 2017-07-25 NOTE — ED Notes (Signed)
[facility] here to transport pt,  

## 2017-07-25 NOTE — ED Provider Notes (Signed)
[facility] DEPT Provider Note   CSN: [id number] Arrival date & time: 07/25/17  0014     History   Chief Complaint Chief Complaint  Patient presents with  . Fall    HPI Erika Mcconnell is a 83 y.o. female.  Level V caveat for dementia. Patient from nursing home where she was found on the floor. Patient states she was sitting in her recliner when she fell forward striking her head. She has a hematoma and small laceration to her right forehead. Patient denies any pain anywhere else. She is not anticoagulated. She denies any neck or back pain. No chest pain or abdominal pain. She appears to be at her neurological baseline per her report.   The history is provided by the patient, the EMS personnel and the nursing home. The history is limited by the condition of the patient.    Past Medical History:  Diagnosis Date  . Anxiety   . Chronic back pain   . CKD (chronic kidney disease)   . Cognitive communication deficit   . Coronary artery disease   . Dementia   . Depression   . Diverticulitis   . Diverticulosis   . Dysphagia   . Falls frequently 3/12   balance disturbance ( referred to Nurologist )  . Gastritis, Helicobacter pylori   . GI bleed   . Hyperlipemia   . Hyperlipidemia   . Hypertension   . IDDM (insulin dependent diabetes mellitus) (HCC)   . Insomnia   . Lumbar radiculopathy   . Osteoporosis   . Parkinson disease (HCC)   . Postmenopausal HRT (hormone replacement therapy)   . Restless leg syndrome   . S/P lens implant    Implanted Contacts   . Seizures (HCC)   . Urinary retention   . Weakness generalized     Patient Active Problem List   Diagnosis Date Noted  . GIB (gastrointestinal bleeding) 01/29/2017  . GI bleed 01/28/2017  . ARF (acute renal failure) (HCC) 01/28/2017  . Rectal bleeding   . SOB (shortness of breath)   . Anemia   . Leukocytosis   . Lumbar radicular syndrome 02/06/2013  . Back pain, chronic 02/06/2013  . Depression 02/06/2013   . HLD (hyperlipidemia) 02/06/2013  . DM2 (diabetes mellitus, type 2) (HCC) 02/14/2008  . Essential hypertension 02/14/2008  . BLOOD IN STOOL, OCCULT 02/14/2008    Past Surgical History:  Procedure Laterality Date  . APPENDECTOMY    . bilateral cataract surgery    . cataract surgery  1984   with lens implants   . CHOLECYSTECTOMY    . Disc Surgery x3     1977, 1984, & 1995  . dorsal column stimulator    . hip replacement for arthritis  2009  . Surgery to control back pain  3/99   Dr. Dr. Vena Rua   . TONSILLECTOMY    . TOTAL ABDOMINAL HYSTERECTOMY W/ BILATERAL SALPINGOOPHORECTOMY  1983    OB History    No data available       Home Medications    Prior to Admission medications   Medication Sig Start Date End Date Taking? Authorizing Provider  amLODipine (NORVASC) 10 MG tablet Take 1 tablet (10 mg total) by mouth daily. 01/30/17   Leroy Sea, MD  aspirin 81 MG tablet Take 1 tablet (81 mg total) by mouth daily. 02/04/17   Leroy Sea, MD  carbidopa-levodopa (SINEMET IR) 25-100 MG per tablet Take 1 tablet by mouth 3 (three) times daily.  01/09/13   [provider]  Cholecalciferol (VITAMIN D-3 PO) Take 50,000 Units/day by mouth. 1 x a month    [provider]  clonazePAM (KLONOPIN) 0.5 MG tablet Take 1 tablet (0.5 mg total) by mouth 2 (two) times daily as needed for anxiety. 01/24/13   Levert Feinstein, MD  denosumab (PROLIA) 60 MG/ML SOLN injection Inject 60 mg into the skin every 6 (six) months. Administer in upper arm, thigh, or abdomen    [provider]  docusate sodium (COLACE) 100 MG capsule Take 100 mg by mouth 2 (two) times daily.    [provider]  DULoxetine (CYMBALTA) 20 MG capsule Take 20 mg by mouth daily.    [provider]  ezetimibe-simvastatin (VYTORIN) 10-20 MG tablet Take 1 tablet by mouth at bedtime.    [provider]  HYDROcodone-acetaminophen (NORCO) 7.5-325 MG tablet Take 1 tablet by mouth every 8  (eight) hours as needed for moderate pain.    [provider]  insulin glargine (LANTUS) 100 UNIT/ML injection Inject 30 Units into the skin at bedtime.    [provider]  insulin lispro (HUMALOG) 100 UNIT/ML injection Inject 6 Units into the skin 3 (three) times daily before meals. At meals and bedtime if glucose is greater than or equal to 150    [provider]  Ethelda Chick (OYSTER CALCIUM) 500 MG TABS tablet Take 500 mg of elemental calcium by mouth 3 (three) times daily.    [provider]  pantoprazole (PROTONIX) 40 MG tablet Take 1 tablet (40 mg total) by mouth 2 (two) times daily. 01/30/17   Leroy Sea, MD  Polyethyl Glycol-Propyl Glycol (SYSTANE ULTRA OP) Apply 1 drop to eye.    [provider]  polyethylene glycol (MIRALAX / GLYCOLAX) packet Take 17 g by mouth daily.    [provider]  pregabalin (LYRICA) 75 MG capsule Take 75 mg by mouth daily.      [provider]  sitaGLIPtin (JANUVIA) 50 MG tablet Take 50 mg by mouth daily.    [provider]    Family History No family history on file.  Social History Social History  Substance Use Topics  . Smoking status: Never Smoker  . Smokeless tobacco: Never Used  . Alcohol use No     Allergies   Amitriptyline hcl; Cortisone; Crestor [rosuvastatin calcium]; Fentanyl; Lipitor [atorvastatin calcium]; Morphine; and Oxycodone hcl   Review of Systems Review of Systems  Unable to perform ROS: Dementia     Physical Exam Updated Vital Signs BP (!) 162/60   Pulse 60   Temp 98.3 F (36.8 C) (Oral)   Resp 16   Ht 5\' 7"  (1.702 m)   Wt 81.6 kg (180 lb)   SpO2 100%   BMI 28.19 kg/m   Physical Exam  Constitutional: She appears well-developed and well-nourished. No distress.  HENT:  Head: Normocephalic and atraumatic.  Mouth/Throat: Oropharynx is clear and moist. No oropharyngeal exudate.  Hematoma right forehead. 2 cm curvilinear laceration with  oozing  Eyes: Pupils are equal, round, and reactive to light. Conjunctivae and EOM are normal.  Neck: Normal range of motion. Neck supple.  No C-spine tenderness  Cardiovascular: Normal rate, regular rhythm, normal heart sounds and intact distal pulses.   No murmur heard. Pulmonary/Chest: Effort normal and breath sounds normal. No respiratory distress.  Abdominal: Soft. There is no tenderness. There is no rebound and no guarding.  Musculoskeletal: Normal range of motion. She exhibits no edema or tenderness.  Full range of motion of hips without pain. No T or L-spine tenderness  Neurological: She is alert. No cranial nerve deficit. She exhibits normal muscle tone. Coordination normal.  Patient oriented to person only. Moving all extremities equally, no facial droop Speech is slow and delayed  Skin: Skin is warm.  Psychiatric: She has a normal mood and affect. Her behavior is normal.  Nursing note and vitals reviewed.    ED Treatments / Results  Labs (all labs ordered are listed, but only abnormal results are displayed) Labs Reviewed  CBG MONITORING, ED - Abnormal; Notable for the following:       Result Value   Glucose-Capillary 254 (*)    All other components within normal limits    EKG  EKG Interpretation  Date/Time:  Sunday July 25 2017 01:11:47 EDT Ventricular Rate:  58 PR Interval:    QRS Duration: 99 QT Interval:  445 QTC Calculation: 438 R Axis:   -65 Text Interpretation:  sinus  rhythm Left anterior fascicular block Low voltage, precordial leads Consider anterior infarct Borderline T abnormalities, inferior leads No significant change was found Confirmed by Glynn Octave [phone]) on 07/25/2017 1:23:52 AM       Radiology Dg Chest 2 View  Result Date: 07/25/2017 CLINICAL DATA:  Post unwitnessed fall. EXAM: CHEST  2 VIEW COMPARISON:  Radiograph 01/28/2017 FINDINGS: Lower lung volumes from prior exam. Cardiomegaly and vascular congestion are grossly stable  allowing for differences in technique. No confluent consolidation, pneumothorax or pleural fluid. Aortic atherosclerosis is noted. The bones are under mineralized, no evidence of acute abnormality. IMPRESSION: Low lung volumes with unchanged cardiomegaly and vascular congestion. No acute abnormality. Electronically Signed   By: Rubye Oaks M.D.   On: 07/25/2017 01:32   Dg Pelvis 1-2 Views  Result Date: 07/25/2017 CLINICAL DATA:  Post unwitnessed fall. EXAM: PELVIS - 1-2 VIEW COMPARISON:  None. FINDINGS: Intact right hip arthroplasty where included common distal femoral stem excluded from the field of view. No periprosthetic fracture. Cortical margins of the bony pelvis are intact. Pubic symphysis and sacroiliac joints are congruent. Age related degenerative change of the left hip. IMPRESSION: No evidence of pelvic or periprosthetic right hip fracture. Right hip arthroplasty included where visualized. Electronically Signed   By: Rubye Oaks M.D.   On: 07/25/2017 01:07   Ct Head Wo Contrast  Result Date: 07/25/2017 CLINICAL DATA:  Patient found on floor after sliding from recliner and hitting her head. Laceration to the forehead. EXAM: CT HEAD WITHOUT CONTRAST CT CERVICAL SPINE WITHOUT CONTRAST TECHNIQUE: Multidetector CT imaging of the head and cervical spine was performed following the standard protocol without intravenous contrast. Multiplanar CT image reconstructions of the cervical spine were also generated. COMPARISON:  09/25/2012 head CT, cervical spine 08/22/2011 FINDINGS: CT HEAD FINDINGS Brain: Chronic small vessel ischemic disease of periventricular white matter. Mild cerebral atrophy. Vascular: Moderate atherosclerosis of the carotid siphons. No hyperdense vessels. Skull: Hyperostosis frontalis of the skull. No underlying skull fracture. Sinuses/Orbits: Clear mastoids and paranasal sinuses. Intact orbits and globes bilateral cataract extractions. Other: Right frontoparietal scalp  contusion and hematoma. CT CERVICAL SPINE FINDINGS Alignment: Mild straightening of cervical lordosis possibly from patient positioning or muscle spasm. Skull base and vertebrae: Intact craniocervical relationship. Calcification along the transverse ligament about the odontoid. The atlantodental interval is normal. Soft tissues and spinal canal: No prevertebral soft tissue swelling. No visible canal hematoma. Disc levels: Chronic moderate degenerative disc space narrowing with osteophytes at C5-6 and C6-7, mild associated uncovertebral joint  osteoarthritis and bilateral mild neural foraminal mild encroachment. Central disc- osteophyte complexes C5-6 and C6-7 touching upon the ventral aspect of the thecal sac. Upper chest: Negative. Other: None IMPRESSION: 1. Right frontoparietal scalp contusion and hematoma. No skull fracture. 2. Chronic small vessel ischemic disease of the brain. Mild cerebral atrophy. 3. No acute intracranial abnormality. 4. Chronic moderate degenerative disc disease C5-6 and C6-7. 5. No acute cervical spine fracture or posttraumatic subluxation. Electronically Signed   By: Tollie Eth M.D.   On: 07/25/2017 01:48   Ct Cervical Spine Wo Contrast  Result Date: 07/25/2017 CLINICAL DATA:  Patient found on floor after sliding from recliner and hitting her head. Laceration to the forehead. EXAM: CT HEAD WITHOUT CONTRAST CT CERVICAL SPINE WITHOUT CONTRAST TECHNIQUE: Multidetector CT imaging of the head and cervical spine was performed following the standard protocol without intravenous contrast. Multiplanar CT image reconstructions of the cervical spine were also generated. COMPARISON:  09/25/2012 head CT, cervical spine 08/22/2011 FINDINGS: CT HEAD FINDINGS Brain: Chronic small vessel ischemic disease of periventricular white matter. Mild cerebral atrophy. Vascular: Moderate atherosclerosis of the carotid siphons. No hyperdense vessels. Skull: Hyperostosis frontalis of the skull. No underlying skull  fracture. Sinuses/Orbits: Clear mastoids and paranasal sinuses. Intact orbits and globes bilateral cataract extractions. Other: Right frontoparietal scalp contusion and hematoma. CT CERVICAL SPINE FINDINGS Alignment: Mild straightening of cervical lordosis possibly from patient positioning or muscle spasm. Skull base and vertebrae: Intact craniocervical relationship. Calcification along the transverse ligament about the odontoid. The atlantodental interval is normal. Soft tissues and spinal canal: No prevertebral soft tissue swelling. No visible canal hematoma. Disc levels: Chronic moderate degenerative disc space narrowing with osteophytes at C5-6 and C6-7, mild associated uncovertebral joint osteoarthritis and bilateral mild neural foraminal mild encroachment. Central disc- osteophyte complexes C5-6 and C6-7 touching upon the ventral aspect of the thecal sac. Upper chest: Negative. Other: None IMPRESSION: 1. Right frontoparietal scalp contusion and hematoma. No skull fracture. 2. Chronic small vessel ischemic disease of the brain. Mild cerebral atrophy. 3. No acute intracranial abnormality. 4. Chronic moderate degenerative disc disease C5-6 and C6-7. 5. No acute cervical spine fracture or posttraumatic subluxation. Electronically Signed   By: Tollie Eth M.D.   On: 07/25/2017 01:48    Procedures .Marland KitchenLaceration Repair Date/Time: 07/25/2017 1:20 AM Performed by: Glynn Octave Authorized by: Glynn Octave   Consent:    Consent obtained:  Verbal   Consent given by:  Patient   Risks discussed:  Poor cosmetic result, infection and pain Anesthesia (see MAR for exact dosages):    Anesthesia method:  Local infiltration   Local anesthetic:  Lidocaine 1% WITH epi Laceration details:    Location:  Face   Face location:  Forehead   Length (cm):  2 Repair type:    Repair type:  Simple Pre-procedure details:    Preparation:  Patient was prepped and draped in usual sterile fashion and imaging obtained to  evaluate for foreign bodies Exploration:    Hemostasis achieved with:  Epinephrine   Contaminated: no   Treatment:    Area cleansed with:  Betadine   Amount of cleaning:  Standard   Irrigation solution:  Sterile saline   Irrigation method:  Syringe   Visualized foreign bodies/material removed: no   Skin repair:    Repair method:  Sutures   Suture size:  6-0   Suture material:  Nylon   Suture technique:  Simple interrupted   Number of sutures:  3 Approximation:    Approximation:  Close Post-procedure details:    Dressing:  Antibiotic ointment and non-adherent dressing   Patient tolerance of procedure:  Tolerated well, no immediate complications   (including critical care time)  Medications Ordered in ED Medications - No data to display   Initial Impression / Assessment and Plan / ED Course  I have reviewed the triage vital signs and the nursing notes.  Pertinent labs & imaging results that were available during my care of the patient were reviewed by me and considered in my medical decision making (see chart for details).    Patient nursing home with apparent mechanical fall. Reportedly at neurological baseline. With head injury we'll obtain CT scan.  CT head and C-spine are negative. Patient appears to be at her baseline mentation. Laceration repaired as above.  Patient appears stable to return to her facility. She is not ambulatory at baseline. Suture removal in one week. Return precautions discussed.  Final Clinical Impressions(s) / ED Diagnoses   Final diagnoses:  Injury of head, initial encounter  Facial laceration, initial encounter    New Prescriptions New Prescriptions   No medications on file     Glynn Octave, MD 07/25/17 [phone]

## 2017-07-25 NOTE — ED Triage Notes (Signed)
Pt is a resident of [facility] who staff found in floor, ems reports that they were told that pt slid from her recliner to the floor hitting her head, pt has laceration noted to forehead area, bleeding controlled,

## 2017-07-25 NOTE — ED Notes (Signed)
ED Provider at bedside. 

## 2017-07-25 NOTE — ED Notes (Signed)
Dr. Manus Gunning in with pt at this time suturing wound

## 2017-07-25 NOTE — Discharge Instructions (Signed)
Keep wound clean and dry. Followup in 1 week for suture removal. Return to the ED if you develop new or worsening symptoms.

## 2017-07-30 ENCOUNTER — Encounter (HOSPITAL_COMMUNITY): Payer: Self-pay | Admitting: Emergency Medicine

## 2017-07-30 ENCOUNTER — Emergency Department (HOSPITAL_COMMUNITY): Payer: Medicare Other

## 2017-07-30 ENCOUNTER — Emergency Department (HOSPITAL_COMMUNITY)
Admission: EM | Admit: 2017-07-30 | Discharge: 2017-07-30 | Disposition: A | Discharge status: Home / Self Care | Payer: Medicare Other | Attending: Emergency Medicine | Admitting: Emergency Medicine

## 2017-07-30 DIAGNOSIS — N189 Chronic kidney disease, unspecified: Secondary | ICD-10-CM | POA: Diagnosis not present

## 2017-07-30 DIAGNOSIS — R531 Weakness: Secondary | ICD-10-CM | POA: Diagnosis not present

## 2017-07-30 DIAGNOSIS — Z79899 Other long term (current) drug therapy: Secondary | ICD-10-CM | POA: Insufficient documentation

## 2017-07-30 DIAGNOSIS — E1122 Type 2 diabetes mellitus with diabetic chronic kidney disease: Secondary | ICD-10-CM | POA: Insufficient documentation

## 2017-07-30 DIAGNOSIS — I129 Hypertensive chronic kidney disease with stage 1 through stage 4 chronic kidney disease, or unspecified chronic kidney disease: Secondary | ICD-10-CM | POA: Insufficient documentation

## 2017-07-30 DIAGNOSIS — Z7982 Long term (current) use of aspirin: Secondary | ICD-10-CM | POA: Insufficient documentation

## 2017-07-30 DIAGNOSIS — Z794 Long term (current) use of insulin: Secondary | ICD-10-CM | POA: Diagnosis not present

## 2017-07-30 DIAGNOSIS — G20 Parkinson's disease: Secondary | ICD-10-CM | POA: Insufficient documentation

## 2017-07-30 DIAGNOSIS — R52 Pain, unspecified: Secondary | ICD-10-CM

## 2017-07-30 DIAGNOSIS — I2510 Atherosclerotic heart disease of native coronary artery without angina pectoris: Secondary | ICD-10-CM | POA: Insufficient documentation

## 2017-07-30 LAB — CBC WITH DIFFERENTIAL/PLATELET
Basophils Absolute: 0 10*3/uL (ref 0.0–0.1)
Basophils Relative: 1 %
EOS PCT: 2 %
Eosinophils Absolute: 0.2 10*3/uL (ref 0.0–0.7)
HCT: 32.3 % — ABNORMAL LOW (ref 36.0–46.0)
HEMOGLOBIN: 10.9 g/dL — AB (ref 12.0–15.0)
LYMPHS ABS: 3.8 10*3/uL (ref 0.7–4.0)
LYMPHS PCT: 43 %
MCH: 30.8 pg (ref 26.0–34.0)
MCHC: 33.7 g/dL (ref 30.0–36.0)
MCV: 91.2 fL (ref 78.0–100.0)
MONOS PCT: 9 %
Monocytes Absolute: 0.8 10*3/uL (ref 0.1–1.0)
Neutro Abs: 4.1 10*3/uL (ref 1.7–7.7)
Neutrophils Relative %: 45 %
PLATELETS: 256 10*3/uL (ref 150–400)
RBC: 3.54 MIL/uL — AB (ref 3.87–5.11)
RDW: 13.1 % (ref 11.5–15.5)
WBC: 8.8 10*3/uL (ref 4.0–10.5)

## 2017-07-30 LAB — BASIC METABOLIC PANEL
Anion gap: 10 (ref 5–15)
BUN: 33 mg/dL — AB (ref 6–20)
CHLORIDE: 108 mmol/L (ref 101–111)
CO2: 22 mmol/L (ref 22–32)
CREATININE: 1.7 mg/dL — AB (ref 0.44–1.00)
Calcium: 9.3 mg/dL (ref 8.9–10.3)
GFR calc Af Amer: 30 mL/min — ABNORMAL LOW (ref >60)
GFR calc non Af Amer: 26 mL/min — ABNORMAL LOW (ref >60)
GLUCOSE: 157 mg/dL — AB (ref 65–99)
POTASSIUM: 4.3 mmol/L (ref 3.5–5.1)
Sodium: 140 mmol/L (ref 135–145)

## 2017-07-30 MED ORDER — LEVOFLOXACIN 500 MG PO TABS
500.0000 mg | ORAL_TABLET | Freq: Once | ORAL | Status: AC
  Administered 2017-07-30: 500 mg via ORAL
  Filled 2017-07-30: qty 1

## 2017-07-30 MED ORDER — SODIUM CHLORIDE 0.9 % IV BOLUS (SEPSIS)
500.0000 mL | Freq: Once | INTRAVENOUS | Status: AC
  Administered 2017-07-30: 500 mL via INTRAVENOUS

## 2017-07-30 MED ORDER — LEVOFLOXACIN 500 MG PO TABS: 500.0000 mg | ORAL_TABLET | Freq: Every day | ORAL | 0 refills | Status: AC

## 2017-07-30 MED ORDER — ACETAMINOPHEN 325 MG PO TABS
650.0000 mg | ORAL_TABLET | Freq: Once | ORAL | Status: AC
  Administered 2017-07-30: 650 mg via ORAL
  Filled 2017-07-30: qty 2

## 2017-07-30 NOTE — Discharge Instructions (Signed)
Follow-up with your doctor next week for recheck 

## 2017-07-30 NOTE — ED Provider Notes (Signed)
[facility] Provider Note   CSN: [id number] Arrival date & time: 07/30/17  1553     History   Chief Complaint Chief Complaint  Patient presents with  . Fall    HPI Erika Mcconnell is a 83 y.o. female.  Patient was sent over here by his family doctor because she fell hit her head last week and seems to be a little bit more lethargic. Her family member states that she is close to her normal   The history is provided by a relative. No language interpreter was used.  Fall  This is a new problem. The current episode started 2 days ago. The problem occurs constantly. The problem has not changed since onset.Pertinent negatives include no chest pain. Nothing aggravates the symptoms. Nothing relieves the symptoms. She has tried nothing for the symptoms. The treatment provided no relief.  Illness  Pertinent negatives include no chest pain.    Past Medical History:  Diagnosis Date  . Anxiety   . Chronic back pain   . CKD (chronic kidney disease)   . Cognitive communication deficit   . Coronary artery disease   . Dementia   . Depression   . Diverticulitis   . Diverticulosis   . Dysphagia   . Falls frequently 3/12   balance disturbance ( referred to Nurologist )  . Gastritis, Helicobacter pylori   . GI bleed   . Hyperlipemia   . Hyperlipidemia   . Hypertension   . IDDM (insulin dependent diabetes mellitus) (HCC)   . Insomnia   . Lumbar radiculopathy   . Osteoporosis   . Parkinson disease (HCC)   . Postmenopausal HRT (hormone replacement therapy)   . Restless leg syndrome   . S/P lens implant    Implanted Contacts   . Seizures (HCC)   . Urinary retention   . Weakness generalized     Patient Active Problem List   Diagnosis Date Noted  . GIB (gastrointestinal bleeding) 01/29/2017  . GI bleed 01/28/2017  . ARF (acute renal failure) (HCC) 01/28/2017  . Rectal bleeding   . SOB (shortness of breath)   . Anemia   . Leukocytosis   . Lumbar radicular syndrome  02/06/2013  . Back pain, chronic 02/06/2013  . Depression 02/06/2013  . HLD (hyperlipidemia) 02/06/2013  . DM2 (diabetes mellitus, type 2) (HCC) 02/14/2008  . Essential hypertension 02/14/2008  . BLOOD IN STOOL, OCCULT 02/14/2008    Past Surgical History:  Procedure Laterality Date  . APPENDECTOMY    . bilateral cataract surgery    . cataract surgery  1984   with lens implants   . CHOLECYSTECTOMY    . Disc Surgery x3     1977, 1984, & 1995  . dorsal column stimulator    . hip replacement for arthritis  2009  . Surgery to control back pain  3/99   Dr. Dr. Vena Rua   . TONSILLECTOMY    . TOTAL ABDOMINAL HYSTERECTOMY W/ BILATERAL SALPINGOOPHORECTOMY  1983    OB History    No data available       Home Medications    Prior to Admission medications   Medication Sig Start Date End Date Taking? Authorizing Provider  Amino Acids-Protein Hydrolys (FEEDING SUPPLEMENT, PRO-STAT 64,) LIQD Take 30 mLs by mouth every morning.   Yes [provider]  amLODipine (NORVASC) 10 MG tablet Take 1 tablet (10 mg total) by mouth daily. 01/30/17  Yes Leroy Sea, MD  aspirin 81 MG tablet Take 1 tablet (  81 mg total) by mouth daily. 02/04/17  Yes Leroy Sea, MD  carbidopa-levodopa (SINEMET IR) 25-100 MG per tablet Take 1 tablet by mouth 3 (three) times daily.  01/09/13  Yes [provider]  carboxymethylcellulose (REFRESH TEARS) 0.5 % SOLN Place 1 drop into both eyes 3 (three) times daily.   Yes [provider]  clonazePAM (KLONOPIN) 0.5 MG tablet Take 1 tablet (0.5 mg total) by mouth 2 (two) times daily as needed for anxiety. 01/24/13  Yes Levert Feinstein, MD  denosumab (PROLIA) 60 MG/ML SOLN injection Inject 60 mg into the skin every 6 (six) months. Administer in upper arm, thigh, or abdomen   Yes [provider]  docusate sodium (COLACE) 100 MG capsule Take 100 mg by mouth 2 (two) times daily.   Yes [provider]  DULoxetine (CYMBALTA) 20 MG capsule  Take 20 mg by mouth daily.   Yes [provider]  ezetimibe-simvastatin (VYTORIN) 10-20 MG tablet Take 1 tablet by mouth at bedtime.   Yes [provider]  HYDROcodone-acetaminophen (NORCO) 7.5-325 MG tablet Take 1 tablet by mouth 3 (three) times daily.    Yes [provider]  insulin glargine (LANTUS) 100 UNIT/ML injection Inject 32 Units into the skin at bedtime.    Yes [provider]  insulin lispro (HUMALOG) 100 UNIT/ML injection Inject 6 Units into the skin 3 (three) times daily before meals. At meals and bedtime if glucose is greater than or equal to 150   Yes [provider]  lisinopril (PRINIVIL,ZESTRIL) 5 MG tablet Take 5 mg by mouth every morning.   Yes [provider]  Multiple Vitamin (MULTIVITAMIN WITH MINERALS) TABS tablet Take 1 tablet by mouth daily.   Yes [provider]  Ethelda Chick (OYSTER CALCIUM) 500 MG TABS tablet Take 500 mg of elemental calcium by mouth 3 (three) times daily.   Yes [provider]  pantoprazole (PROTONIX) 40 MG tablet Take 1 tablet (40 mg total) by mouth 2 (two) times daily. 01/30/17  Yes Leroy Sea, MD  polyethylene glycol (MIRALAX / GLYCOLAX) packet Take 17 g by mouth daily.   Yes [provider]  pregabalin (LYRICA) 75 MG capsule Take 75 mg by mouth 2 (two) times daily.    Yes [provider]  Propylene Glycol (SYSTANE BALANCE) 0.6 % SOLN Apply 1 drop to eye 3 (three) times daily.   Yes [provider]  sitaGLIPtin (JANUVIA) 50 MG tablet Take 50 mg by mouth daily.   Yes [provider]  vitamin C (ASCORBIC ACID) 500 MG tablet Take 500 mg by mouth 2 (two) times daily.   Yes [provider]  Vitamin D, Ergocalciferol, (DRISDOL) 50000 units CAPS capsule Take 50,000 Units by mouth every 30 (thirty) days. Administered on the 20th day of each month   Yes [provider]  levofloxacin (LEVAQUIN) 500 MG tablet Take 1 tablet (500 mg  total) by mouth daily. 07/30/17   Bethann Berkshire, MD    Family History No family history on file.  Social History Social History  Substance Use Topics  . Smoking status: Never Smoker  . Smokeless tobacco: Never Used  . Alcohol use No     Allergies   Amitriptyline hcl; Cortisone; Crestor [rosuvastatin calcium]; Lipitor [atorvastatin calcium]; Morphine; Oxycodone hcl; and Fentanyl   Review of Systems Review of Systems  Unable to perform ROS: Mental status change  Cardiovascular: Negative for chest pain.     Physical Exam Updated Vital Signs BP Marland Kitchen)  151/55   Pulse 74   Temp 98.6 F (37 C) (Rectal)   Resp 20   Wt 81.6 kg (180 lb)   SpO2 100%   BMI 28.19 kg/m   Physical Exam  Constitutional: She appears well-developed.  HENT:  Head: Normocephalic.  Eyes: Conjunctivae and EOM are normal. No scleral icterus.  Neck: Neck supple. No thyromegaly present.  Cardiovascular: Normal rate and regular rhythm.  Exam reveals no gallop and no friction rub.   No murmur heard. Pulmonary/Chest: No stridor. She has no wheezes. She has no rales. She exhibits no tenderness.  Abdominal: She exhibits no distension. There is no tenderness. There is no rebound.  Musculoskeletal: Normal range of motion. She exhibits no edema.  Lymphadenopathy:    She has no cervical adenopathy.  Neurological: She is alert. She exhibits normal muscle tone. Coordination normal.  Oriented to person only  Skin: No rash noted. No erythema.  Psychiatric: She has a normal mood and affect. Her behavior is normal.     ED Treatments / Results  Labs (all labs ordered are listed, but only abnormal results are displayed) Labs Reviewed  CBC WITH DIFFERENTIAL/PLATELET - Abnormal; Notable for the following:       Result Value   RBC 3.54 (*)    Hemoglobin 10.9 (*)    HCT 32.3 (*)    All other components within normal limits  BASIC METABOLIC PANEL - Abnormal; Notable for the following:    Glucose, Bld 157 (*)     BUN 33 (*)    Creatinine, Ser 1.70 (*)    GFR calc non Af Amer 26 (*)    GFR calc Af Amer 30 (*)    All other components within normal limits    EKG  EKG Interpretation None       Radiology Ct Head Wo Contrast  Result Date: 07/30/2017 CLINICAL DATA:  83 y/o  F; recent fall.  Increasing confusion. EXAM: CT HEAD WITHOUT CONTRAST TECHNIQUE: Contiguous axial images were obtained from the base of the skull through the vertex without intravenous contrast. COMPARISON:  07/25/2017 CT of the head. FINDINGS: Brain: No evidence of acute infarction, hemorrhage, hydrocephalus, extra-axial collection or mass lesion/mass effect. Stable chronic microvascular ischemic changes and parenchymal volume loss of the brain. Vascular: Calcific atherosclerosis carotid siphons. No hyperdense vessel identified. Skull: Diminishing right frontal scalp contusion. No calvarial fracture. Sinuses/Orbits: No acute finding. Other: Bilateral intra-ocular lens replacement. IMPRESSION: 1. No acute intracranial abnormality identified. 2. Stable chronic microvascular ischemic changes and parenchymal volume loss of the brain. 3. Diminishing right frontal scalp contusion. Electronically Signed   By: Mitzi Hansen M.D.   On: 07/30/2017 17:56   Dg Chest Port 1 View  Result Date: 07/30/2017 CLINICAL DATA:  Confusion EXAM: PORTABLE CHEST 1 VIEW COMPARISON:  July 25, 2017 FINDINGS: There is a focal area of ill-defined opacity in the right base, suspicious for focal pneumonia. Lungs elsewhere are clear. Heart size and pulmonary vascularity are within normal limits. No adenopathy. There is aortic atherosclerosis. There is degenerative change in each shoulder. IMPRESSION: Focal area of opacity in the right base, felt to represent a focal area of pneumonia. Lungs elsewhere clear. Cardiac silhouette within normal limits. There is aortic atherosclerosis. Followup PA and lateral chest radiographs recommended in 3-4 weeks following  trial of antibiotic therapy to ensure resolution and exclude underlying malignancy. Aortic Atherosclerosis (ICD10-I70.0). Electronically Signed   By: Bretta Bang III M.D.   On: 07/30/2017 17:02    Procedures Procedures (including  critical care time)  Medications Ordered in ED Medications  acetaminophen (TYLENOL) tablet 650 mg (not administered)  levofloxacin (LEVAQUIN) tablet 500 mg (not administered)  sodium chloride 0.9 % bolus 500 mL (0 mLs Intravenous Stopped 07/30/17 1944)     Initial Impression / Assessment and Plan / ED Course  I have reviewed the triage vital signs and the nursing notes.  Pertinent labs & imaging results that were available during my care of the patient were reviewed by me and considered in my medical decision making (see chart for details).     CT scan of the head negative. Patient seems be better normal. Chest x-ray shows possible pneumonia although clinically she does not have pneumonia. She will be empirically treated with Levaquin and will follow-up with her PCP  Final Clinical Impressions(s) / ED Diagnoses   Final diagnoses:  Weakness    New Prescriptions New Prescriptions   LEVOFLOXACIN (LEVAQUIN) 500 MG TABLET    Take 1 tablet (500 mg total) by mouth daily.     Bethann Berkshire, MD 07/30/17 2027

## 2017-07-30 NOTE — ED Triage Notes (Signed)
Pt from [facility].  Larey Seat recently, was seen and dc from ED.  Staff reports pt has been more confused and non verbal starting today

## 2017-07-30 NOTE — ED Notes (Signed)
Awaiting ems to transport pt back to [facility]

## 2017-07-30 NOTE — ED Notes (Signed)
Son at bedside and reports his mothers orientation is not that much different.  States he feels like she is in pain when she breaths.  EDP notified, give vo for chest xray

## 2017-09-30 ENCOUNTER — Emergency Department (HOSPITAL_COMMUNITY)
Admission: EM | Admit: 2017-09-30 | Discharge: 2017-10-01 | Disposition: A | Discharge status: Home / Self Care | Payer: Medicare Other | Attending: Emergency Medicine | Admitting: Emergency Medicine

## 2017-09-30 ENCOUNTER — Emergency Department (HOSPITAL_COMMUNITY): Payer: Medicare Other

## 2017-09-30 ENCOUNTER — Encounter (HOSPITAL_COMMUNITY): Payer: Self-pay

## 2017-09-30 DIAGNOSIS — I2510 Atherosclerotic heart disease of native coronary artery without angina pectoris: Secondary | ICD-10-CM | POA: Diagnosis not present

## 2017-09-30 DIAGNOSIS — E1122 Type 2 diabetes mellitus with diabetic chronic kidney disease: Secondary | ICD-10-CM | POA: Diagnosis not present

## 2017-09-30 DIAGNOSIS — Y999 Unspecified external cause status: Secondary | ICD-10-CM | POA: Diagnosis not present

## 2017-09-30 DIAGNOSIS — F0390 Unspecified dementia without behavioral disturbance: Secondary | ICD-10-CM | POA: Insufficient documentation

## 2017-09-30 DIAGNOSIS — Y939 Activity, unspecified: Secondary | ICD-10-CM | POA: Diagnosis not present

## 2017-09-30 DIAGNOSIS — Z79899 Other long term (current) drug therapy: Secondary | ICD-10-CM | POA: Diagnosis not present

## 2017-09-30 DIAGNOSIS — Z794 Long term (current) use of insulin: Secondary | ICD-10-CM | POA: Diagnosis not present

## 2017-09-30 DIAGNOSIS — S0990XA Unspecified injury of head, initial encounter: Secondary | ICD-10-CM | POA: Diagnosis present

## 2017-09-30 DIAGNOSIS — N189 Chronic kidney disease, unspecified: Secondary | ICD-10-CM | POA: Insufficient documentation

## 2017-09-30 DIAGNOSIS — Z7982 Long term (current) use of aspirin: Secondary | ICD-10-CM | POA: Diagnosis not present

## 2017-09-30 DIAGNOSIS — W19XXXA Unspecified fall, initial encounter: Secondary | ICD-10-CM

## 2017-09-30 DIAGNOSIS — S0181XA Laceration without foreign body of other part of head, initial encounter: Secondary | ICD-10-CM | POA: Diagnosis not present

## 2017-09-30 DIAGNOSIS — Y92129 Unspecified place in nursing home as the place of occurrence of the external cause: Secondary | ICD-10-CM | POA: Insufficient documentation

## 2017-09-30 DIAGNOSIS — W07XXXA Fall from chair, initial encounter: Secondary | ICD-10-CM | POA: Insufficient documentation

## 2017-09-30 DIAGNOSIS — I129 Hypertensive chronic kidney disease with stage 1 through stage 4 chronic kidney disease, or unspecified chronic kidney disease: Secondary | ICD-10-CM | POA: Diagnosis not present

## 2017-09-30 MED ORDER — POVIDONE-IODINE 10 % EX SOLN
CUTANEOUS | Status: AC
  Administered 2017-09-30: 23:00:00
  Filled 2017-09-30: qty 15

## 2017-09-30 MED ORDER — LIDOCAINE HCL (PF) 1 % IJ SOLN
5.0000 mL | Freq: Once | INTRAMUSCULAR | Status: AC
  Administered 2017-09-30: 2 mL
  Filled 2017-09-30: qty 6

## 2017-09-30 NOTE — ED Notes (Signed)
Blood cleaned from forehead. No needs voiced.

## 2017-09-30 NOTE — ED Provider Notes (Signed)
LACERATION REPAIR.  Patient is an 83 year old female who presents to the emergency department following a fall.  The patient sustained a laceration to the forehead.  I reviewed the CT head scans and there is no skull fracture, and no evidence of intracranial hemorrhage or injury injury.  Patient identified by armband.  Procedural timeout taken.  The laceration to the forehead was painted with Betadine.  It was then infiltrated with 1% plain lidocaine.  After appropriate anesthetic was achieved, the wound was prepped and draped in a sterile fashion.  Laceration was irrigated with saline.  Laceration was repaired with 4 interrupted sutures of 6-0 Vicryl Rapide.  Band-Aid applied.  The patient tolerated the procedure without problem.   Ivery QualeBryant, Ritchard Paragas, PA-C 09/30/17 2222    Vanetta MuldersZackowski, Scott, MD 10/01/17 1640

## 2017-09-30 NOTE — ED Provider Notes (Signed)
[facility] Provider Note   CSN: [id number] Arrival date & time: 09/30/17  1743     History   Chief Complaint Chief Complaint  Patient presents with  . Fall    HPI Erika Mcconnell is a 83 y.o. female.  [facility]  Patient had a fall getting out of the chair.  Patient struck her right forehead.  Bleeding from that area has swelling there.  Patient is a DNR.  Patient has a history of dementia.  Patient not on blood thinners.  According to patient's spouse patient had a fall a few weeks ago and hit the same area.  Past medical history states that she has dysphasia.      Past Medical History:  Diagnosis Date  . Anxiety   . Chronic back pain   . CKD (chronic kidney disease)   . Cognitive communication deficit   . Coronary artery disease   . Dementia   . Depression   . Diverticulitis   . Diverticulosis   . Dysphagia   . Falls frequently 3/12   balance disturbance ( referred to Nurologist )  . Gastritis, Helicobacter pylori   . GI bleed   . Hyperlipemia   . Hyperlipidemia   . Hypertension   . IDDM (insulin dependent diabetes mellitus) (HCC)   . Insomnia   . Lumbar radiculopathy   . Osteoporosis   . Parkinson disease (HCC)   . Postmenopausal HRT (hormone replacement therapy)   . Restless leg syndrome   . S/P lens implant    Implanted Contacts   . Seizures (HCC)   . Urinary retention   . Weakness generalized     Patient Active Problem List   Diagnosis Date Noted  . GIB (gastrointestinal bleeding) 01/29/2017  . GI bleed 01/28/2017  . ARF (acute renal failure) (HCC) 01/28/2017  . Rectal bleeding   . SOB (shortness of breath)   . Anemia   . Leukocytosis   . Lumbar radicular syndrome 02/06/2013  . Back pain, chronic 02/06/2013  . Depression 02/06/2013  . HLD (hyperlipidemia) 02/06/2013  . DM2 (diabetes mellitus, type 2) (HCC) 02/14/2008  . Essential hypertension 02/14/2008  . BLOOD IN STOOL,  OCCULT 02/14/2008    Past Surgical History:  Procedure Laterality Date  . APPENDECTOMY    . bilateral cataract surgery    . cataract surgery  1984   with lens implants   . CHOLECYSTECTOMY    . Disc Surgery x3     1977, 1984, & 1995  . dorsal column stimulator    . hip replacement for arthritis  2009  . Surgery to control back pain  3/99   Dr. Dr. Vena RuaGorecks   . TONSILLECTOMY    . TOTAL ABDOMINAL HYSTERECTOMY W/ BILATERAL SALPINGOOPHORECTOMY  1983    OB History    No data available       Home Medications    Prior to Admission medications   Medication Sig Start Date End Date Taking? Authorizing Provider  Amino Acids-Protein Hydrolys (FEEDING SUPPLEMENT, PRO-STAT 64,) LIQD Take 30 mLs by mouth every morning.   Yes [provider]  amLODipine (NORVASC) 10 MG tablet Take 1 tablet (10 mg total) by mouth daily. 01/30/17  Yes Leroy SeaSingh, Prashant K, MD  aspirin 81 MG tablet Take 1 tablet (81 mg total) by mouth daily. 02/04/17  Yes Leroy SeaSingh, Prashant K, MD  carbidopa-levodopa (SINEMET IR) 25-100 MG per tablet Take 1 tablet by mouth 3 (three) times daily.  01/09/13  Yes [provider]  carboxymethylcellulose (REFRESH TEARS) 0.5 % SOLN Place 1 drop into both eyes 3 (three) times daily.   Yes [provider]  clonazePAM (KLONOPIN) 0.5 MG tablet Take 1 tablet (0.5 mg total) by mouth 2 (two) times daily as needed for anxiety. 01/24/13  Yes Levert Feinstein, MD  denosumab (PROLIA) 60 MG/ML SOLN injection Inject 60 mg into the skin every 6 (six) months. Administer in upper arm, thigh, or abdomen   Yes [provider]  docusate sodium (COLACE) 100 MG capsule Take 100 mg by mouth 2 (two) times daily.   Yes [provider]  DULoxetine (CYMBALTA) 20 MG capsule Take 20 mg by mouth daily.   Yes [provider]  HYDROcodone-acetaminophen (NORCO) 7.5-325 MG tablet Take 1 tablet by mouth 3 (three) times daily.    Yes [provider]  insulin glargine (LANTUS)  100 UNIT/ML injection Inject 34 Units into the skin at bedtime.    Yes [provider]  insulin lispro (HUMALOG) 100 UNIT/ML injection Inject 10 Units into the skin 3 (three) times daily before meals. At meals and bedtime if glucose is greater than or equal to 150    Yes [provider]  lisinopril (PRINIVIL,ZESTRIL) 5 MG tablet Take 5 mg by mouth every morning.   Yes [provider]  Multiple Vitamin (MULTIVITAMIN WITH MINERALS) TABS tablet Take 1 tablet by mouth daily.   Yes [provider]  Ethelda Chick (OYSTER CALCIUM) 500 MG TABS tablet Take 500 mg of elemental calcium by mouth 3 (three) times daily.   Yes [provider]  pantoprazole (PROTONIX) 40 MG tablet Take 1 tablet (40 mg total) by mouth 2 (two) times daily. 01/30/17  Yes Leroy Sea, MD  polyethylene glycol (MIRALAX / GLYCOLAX) packet Take 17 g by mouth daily.   Yes [provider]  pregabalin (LYRICA) 75 MG capsule Take 75 mg by mouth 2 (two) times daily.    Yes [provider]  Propylene Glycol (SYSTANE BALANCE) 0.6 % SOLN Apply 1 drop to eye 3 (three) times daily.   Yes [provider]  sitaGLIPtin (JANUVIA) 50 MG tablet Take 50 mg by mouth daily.   Yes [provider]  vitamin C (ASCORBIC ACID) 500 MG tablet Take 500 mg by mouth 2 (two) times daily.   Yes [provider]  Vitamin D, Ergocalciferol, (DRISDOL) 50000 units CAPS capsule Take 50,000 Units by mouth every 30 (thirty) days. Administered on the 20th day of each month   Yes [provider]  levofloxacin (LEVAQUIN) 500 MG tablet Take 1 tablet (500 mg total) by mouth daily. Patient not taking: Reported on 09/30/2017 07/30/17   Bethann Berkshire, MD    Family History History reviewed. No pertinent family history.  Social History Social History   Tobacco Use  . Smoking status: Never Smoker  . Smokeless tobacco: Never Used  Substance Use Topics  . Alcohol use: No  . Drug  use: No     Allergies   Amitriptyline hcl; Cortisone; Crestor [rosuvastatin calcium]; Lipitor [atorvastatin calcium]; Morphine; Oxycodone hcl; and Fentanyl   Review of Systems Review of Systems  Unable to perform ROS: Dementia     Physical Exam Updated Vital Signs BP (!) 145/49   Pulse 65   Temp 98.5 F (36.9 C) (Oral)   Resp 16   Ht 1.676 m (5\' 6" )   Wt 81.6 kg (180 lb)   SpO2 98%   BMI 29.05 kg/m  Physical Exam  Constitutional: She appears well-developed and well-nourished. No distress.  HENT:  Head: Normocephalic.  Mouth/Throat: Oropharynx is clear and moist.  Right forehead area with a hematoma and swelling measuring about 4-5 cm.  There is a superficial V-shaped skin tear not deep.  There is a 2 cm laceration part of the forehead on the right side that will require suture repair.  No active bleeding.  Eyes: Conjunctivae and EOM are normal. Pupils are equal, round, and reactive to light.  Neck: Normal range of motion. Neck supple.  Pulmonary/Chest: Effort normal and breath sounds normal.  Abdominal: Soft. Bowel sounds are normal.  Musculoskeletal: Normal range of motion. She exhibits no deformity.  Neurological: She is alert. No cranial nerve deficit or sensory deficit. She exhibits normal muscle tone. Coordination normal.  Patient able to move all 4 extremities.  Skin: Skin is warm.  Nursing note and vitals reviewed.    ED Treatments / Results  Labs (all labs ordered are listed, but only abnormal results are displayed) Labs Reviewed - No data to display  EKG  EKG Interpretation None       Radiology Ct Head Wo Contrast  Result Date: 09/30/2017 CLINICAL DATA:  Status post fall with laceration to the forehead. History of Parkinson's disease. EXAM: CT HEAD WITHOUT CONTRAST CT CERVICAL SPINE WITHOUT CONTRAST TECHNIQUE: Multidetector CT imaging of the head and cervical spine was performed following the standard protocol without intravenous contrast.  Multiplanar CT image reconstructions of the cervical spine were also generated. COMPARISON:  07/30/2017 FINDINGS: CT HEAD FINDINGS Brain: No evidence of acute infarction, hemorrhage, hydrocephalus, extra-axial collection or mass lesion/mass effect. Moderate brain parenchymal volume loss and periventricular microangiopathy. Vascular: Calcific atherosclerotic disease at the skullbase. Skull: Normal. Negative for fracture or focal lesion. Sinuses/Orbits: No acute finding. Other: Right frontal scalp hematoma. CT CERVICAL SPINE FINDINGS Alignment: Straightening of cervical lordosis, likely due to advanced osteoarthritic changes. Skull base and vertebrae: No acute fracture. No primary bone lesion or focal pathologic process. Soft tissues and spinal canal: No prevertebral fluid or swelling. No visible canal hematoma. Disc levels: Multilevel osteoarthritic changes with disc space narrowing, disc osteophyte complexes formation, remodeling of vertebral bodies and ligamentous calcifications. Moderate to advanced posterior facet arthropathy. Upper chest: Negative. Other: None. IMPRESSION: No acute intracranial abnormality. Moderate brain parenchymal atrophy and chronic microvascular disease. No evidence of acute traumatic injury to cervical spine. Multilevel osteoarthritic changes of the cervical spine. Right frontal scalp hematoma. Electronically Signed   By: Ted Mcalpineobrinka  Dimitrova M.D.   On: 09/30/2017 20:49   Ct Cervical Spine Wo Contrast  Result Date: 09/30/2017 CLINICAL DATA:  Status post fall with laceration to the forehead. History of Parkinson's disease. EXAM: CT HEAD WITHOUT CONTRAST CT CERVICAL SPINE WITHOUT CONTRAST TECHNIQUE: Multidetector CT imaging of the head and cervical spine was performed following the standard protocol without intravenous contrast. Multiplanar CT image reconstructions of the cervical spine were also generated. COMPARISON:  07/30/2017 FINDINGS: CT HEAD FINDINGS Brain: No evidence of acute  infarction, hemorrhage, hydrocephalus, extra-axial collection or mass lesion/mass effect. Moderate brain parenchymal volume loss and periventricular microangiopathy. Vascular: Calcific atherosclerotic disease at the skullbase. Skull: Normal. Negative for fracture or focal lesion. Sinuses/Orbits: No acute finding. Other: Right frontal scalp hematoma. CT CERVICAL SPINE FINDINGS Alignment: Straightening of cervical lordosis, likely due to advanced osteoarthritic changes. Skull base and vertebrae: No acute fracture. No primary bone lesion or focal pathologic process. Soft tissues and spinal canal: No prevertebral fluid or swelling. No visible canal hematoma. Disc  levels: Multilevel osteoarthritic changes with disc space narrowing, disc osteophyte complexes formation, remodeling of vertebral bodies and ligamentous calcifications. Moderate to advanced posterior facet arthropathy. Upper chest: Negative. Other: None. IMPRESSION: No acute intracranial abnormality. Moderate brain parenchymal atrophy and chronic microvascular disease. No evidence of acute traumatic injury to cervical spine. Multilevel osteoarthritic changes of the cervical spine. Right frontal scalp hematoma. Electronically Signed   By: Ted Mcalpine M.D.   On: 09/30/2017 20:49    Procedures Procedures (including critical care time)  Medications Ordered in ED Medications - No data to display   Initial Impression / Assessment and Plan / ED Course  I have reviewed the triage vital signs and the nursing notes.  Pertinent labs & imaging results that were available during my care of the patient were reviewed by me and considered in my medical decision making (see chart for details).    Workup here for the head injury negative head CT negative CT of neck.  The 2 cm laceration to the forehead a physician assistant take care of that and patient is stable for discharge back to nursing facility.  Her mental status is baseline.   Final Clinical  Impressions(s) / ED Diagnoses   Final diagnoses:  Fall, initial encounter  Injury of head, initial encounter  Forehead laceration, initial encounter    ED Discharge Orders    None      Medical screening examination/treatment/procedure(s) were conducted as a shared visit with non-physician practitioner(s) and myself.  I personally evaluated the patient during the encounter.   EKG Interpretation None       Forehead laceration to be repaired by physician assistant.  Other workup done by me.  CT head neck without any acute findings.  Patient's mental status is baseline as per her spouse.  In actuality feels that she is more alert this evening than usual.  Suture removal as per physician assistant.   Vanetta Mulders, MD 09/30/17 2154

## 2017-09-30 NOTE — ED Triage Notes (Signed)
Pt fell out of wheelchair and hit head, fall was not witnessed.

## 2017-09-30 NOTE — Discharge Instructions (Addendum)
Wound care keep wound dry for the next 24 hours.  Sutures in place will dissolve on their own.  Follow-up with your doctor as needed.  Return for any new or worse symptoms.

## 2019-02-11 IMAGING — CT CT HEAD W/O CM
3 series · 14 of 47 positions shown, 16 images · non-contrast
Comparison: 07/25/2017 CT of the head.

CLINICAL DATA: 88 y/o  F; recent fall.  Increasing confusion.

EXAM:
CT HEAD WITHOUT CONTRAST
TECHNIQUE: Contiguous axial images were obtained from the base of the skull
through the vertex without intravenous contrast.

[Series 2: head trauma wo · axial · 0.47mm/px · z∈[+60,+195]mm · 8 of 33 slices shown, 10 images]
[im 3/33  brain]
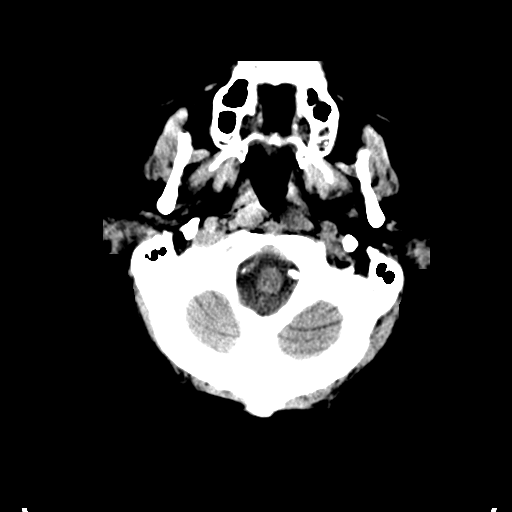
[im 3/33  bone]
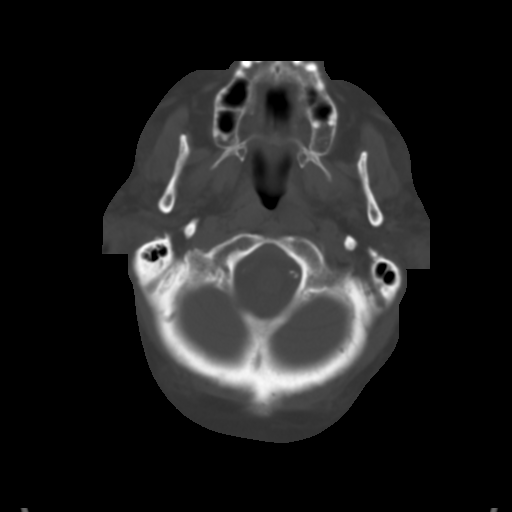
[im 7/33  brain]
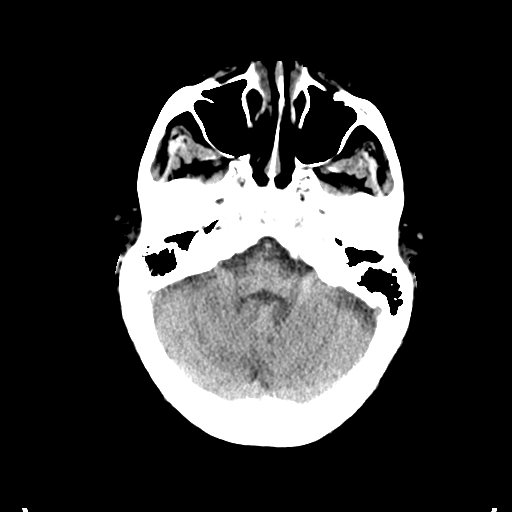
[im 10/33  brain]
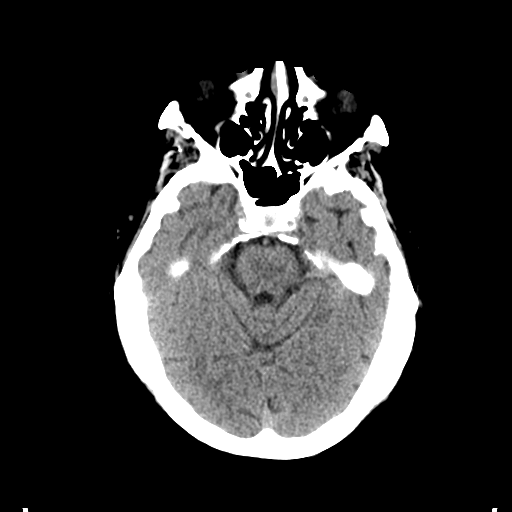
[im 15/33  brain]
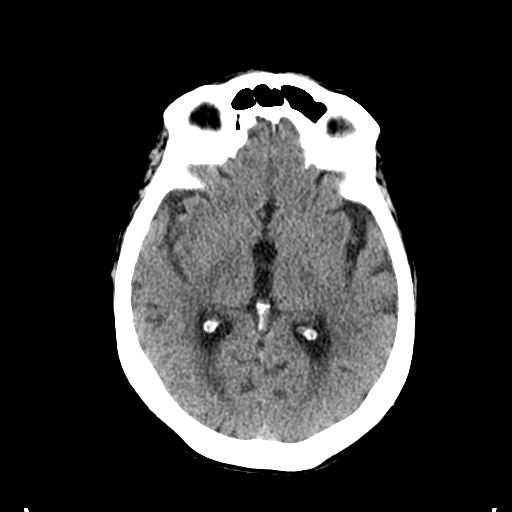
[im 18/33  brain]
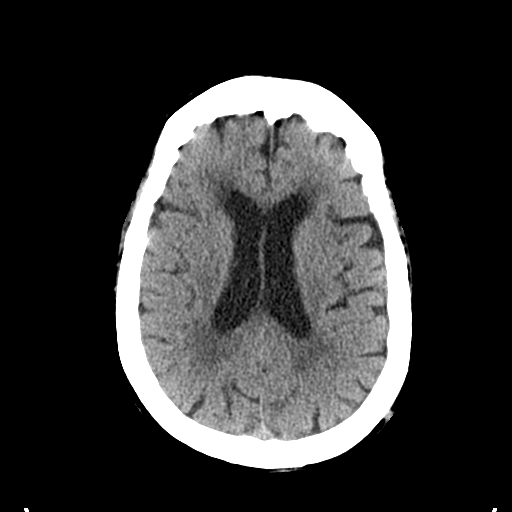
[im 18/33  bone]
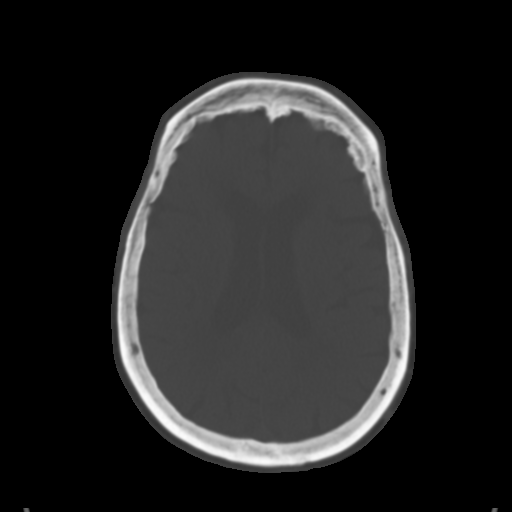
[im 23/33  brain]
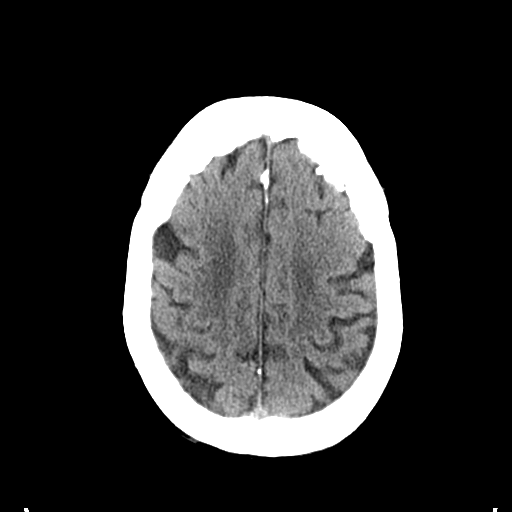
[im 26/33  brain]
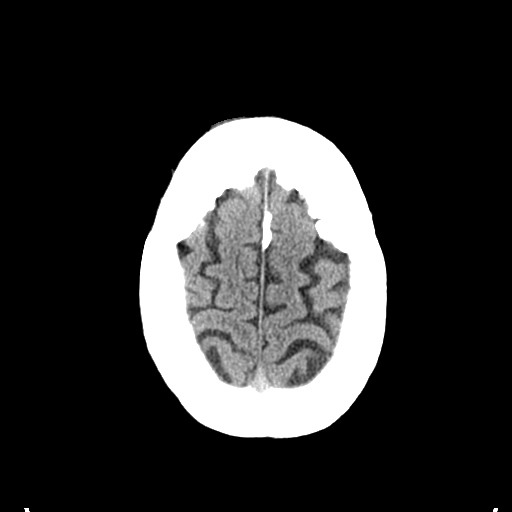
[im 30/33  brain]
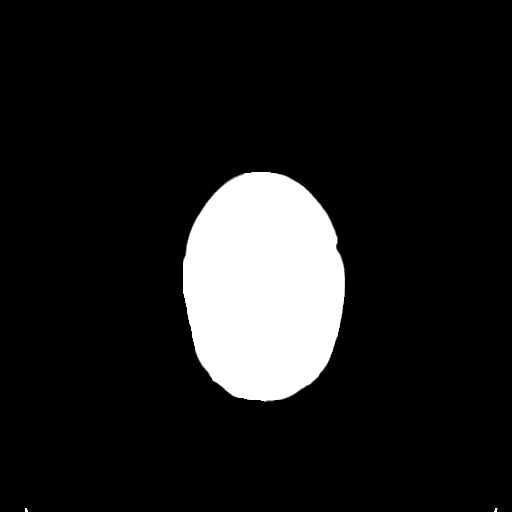

[Series 4: coronal soft tissue · coronal · 0.32mm/px · 3 of 62 slices shown]
[im 21/62  brain]
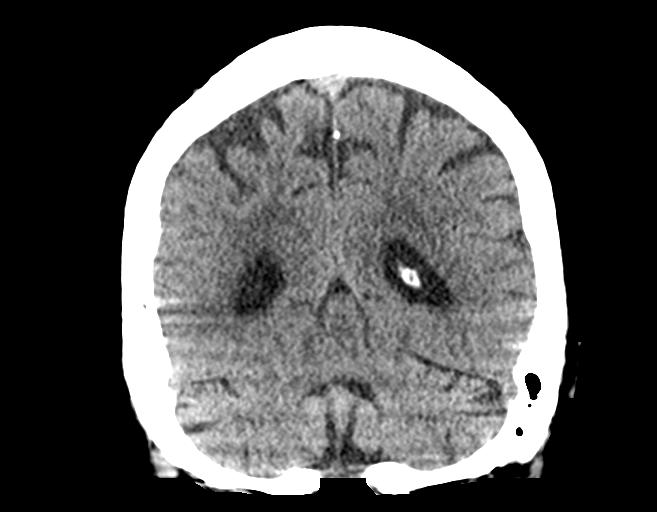
[im 28/62  brain]
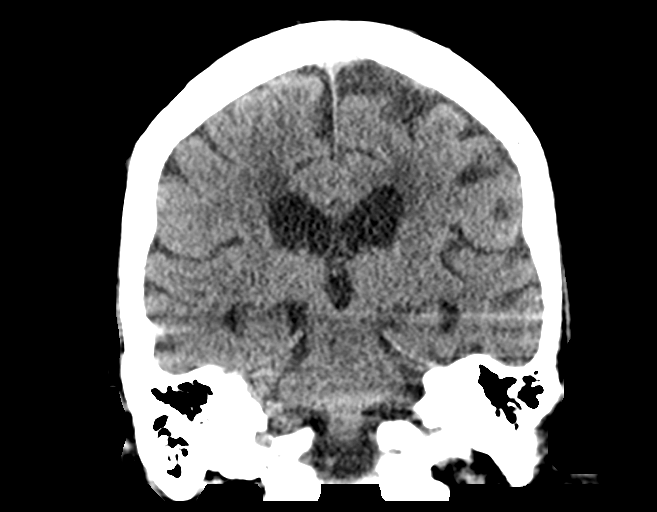
[im 34/62  brain]
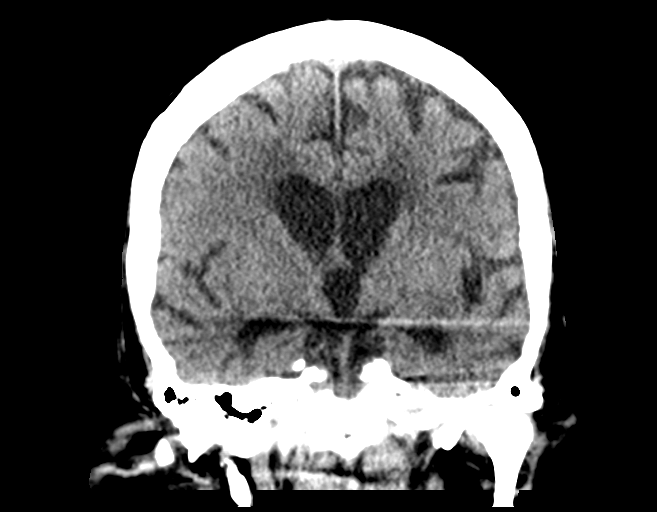

[Series 5: sagittal soft tissue · sagittal · 0.32mm/px · 3 of 51 slices shown]
[im 17/51  brain]
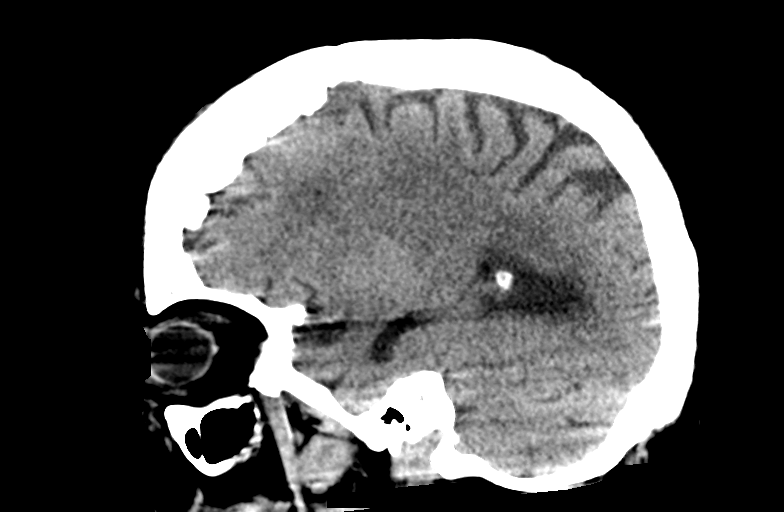
[im 26/51  brain]
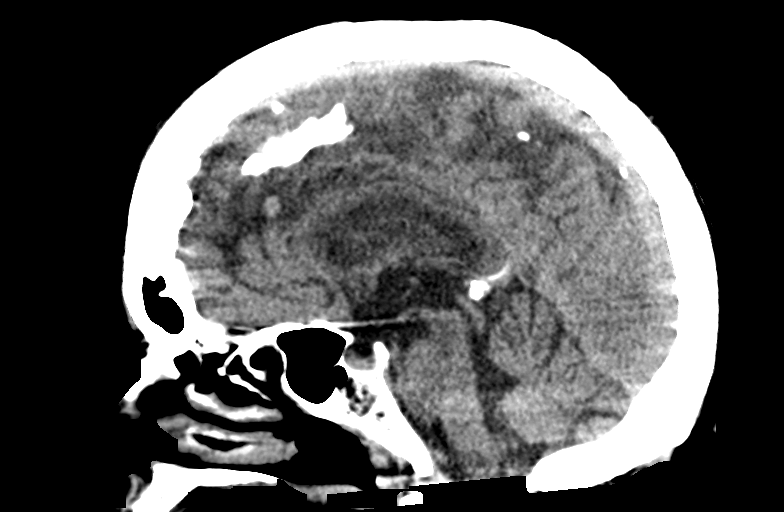
[im 34/51  brain]
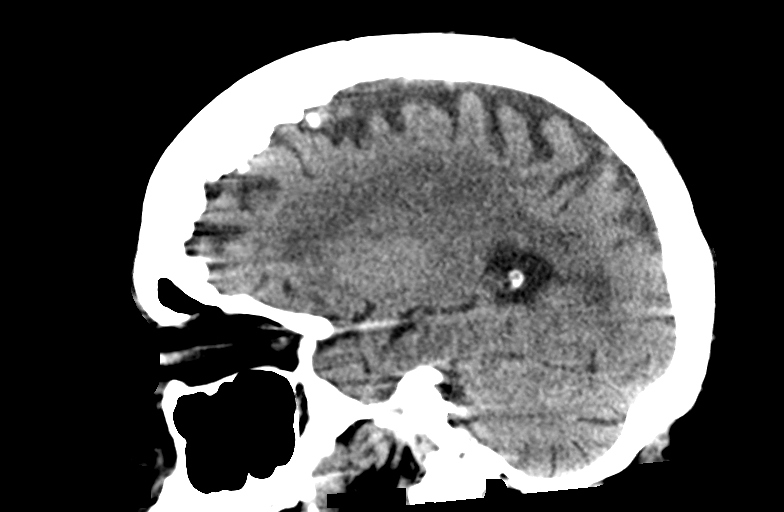

[14 of 47 positions shown; findings below may reference images not displayed]

FINDINGS: Brain: No evidence of acute infarction, hemorrhage, hydrocephalus,
extra-axial collection or mass lesion/mass effect. Stable chronic
microvascular ischemic changes and parenchymal volume loss of the
brain.

Vascular: Calcific atherosclerosis carotid siphons. No hyperdense
vessel identified.

Skull: Diminishing right frontal scalp contusion. No calvarial
fracture.

Sinuses/Orbits: No acute finding.

Other: Bilateral intra-ocular lens replacement.
IMPRESSION: 1. No acute intracranial abnormality identified.
2. Stable chronic microvascular ischemic changes and parenchymal
volume loss of the brain.
3. Diminishing right frontal scalp contusion.

By: Mads Bo Pasqualato M.D.

## 2019-07-27 DEATH — deceased
